# Patient Record
Sex: Female | Born: 1965
Health system: Southern US, Community
[De-identification: ages and names within clinical notes are randomized; demographics above are authoritative.]

## PROBLEM LIST (undated history)

## (undated) DIAGNOSIS — E038 Other specified hypothyroidism: Secondary | ICD-10-CM

## (undated) DIAGNOSIS — K219 Gastro-esophageal reflux disease without esophagitis: Secondary | ICD-10-CM

## (undated) DIAGNOSIS — Z87442 Personal history of urinary calculi: Secondary | ICD-10-CM

## (undated) DIAGNOSIS — N201 Calculus of ureter: Secondary | ICD-10-CM

## (undated) DIAGNOSIS — R112 Nausea with vomiting, unspecified: Secondary | ICD-10-CM

## (undated) DIAGNOSIS — Z9889 Other specified postprocedural states: Secondary | ICD-10-CM

## (undated) DIAGNOSIS — E039 Hypothyroidism, unspecified: Secondary | ICD-10-CM

## (undated) DIAGNOSIS — Z8679 Personal history of other diseases of the circulatory system: Secondary | ICD-10-CM

## (undated) DIAGNOSIS — R350 Frequency of micturition: Secondary | ICD-10-CM

## (undated) DIAGNOSIS — E063 Autoimmune thyroiditis: Secondary | ICD-10-CM

## (undated) DIAGNOSIS — R3915 Urgency of urination: Secondary | ICD-10-CM

## (undated) DIAGNOSIS — R7303 Prediabetes: Secondary | ICD-10-CM

## (undated) DIAGNOSIS — Z8489 Family history of other specified conditions: Secondary | ICD-10-CM

---

## 1998-01-03 ENCOUNTER — Other Ambulatory Visit: Admission: RE | Admit: 1998-01-03 | Discharge: 1998-01-03 | Payer: Self-pay | Admitting: Obstetrics and Gynecology

## 1998-10-29 ENCOUNTER — Encounter: Admission: RE | Admit: 1998-10-29 | Discharge: 1998-12-24 | Payer: Self-pay | Admitting: Family Medicine

## 1999-01-29 ENCOUNTER — Encounter: Payer: Self-pay | Admitting: Orthopedic Surgery

## 1999-01-29 ENCOUNTER — Ambulatory Visit (HOSPITAL_COMMUNITY): Admission: RE | Admit: 1999-01-29 | Discharge: 1999-01-29 | Payer: Self-pay | Admitting: Orthopedic Surgery

## 1999-02-11 ENCOUNTER — Other Ambulatory Visit: Admission: RE | Admit: 1999-02-11 | Discharge: 1999-02-11 | Payer: Self-pay | Admitting: Obstetrics and Gynecology

## 1999-07-23 ENCOUNTER — Other Ambulatory Visit: Admission: RE | Admit: 1999-07-23 | Discharge: 1999-07-23 | Payer: Self-pay | Admitting: Obstetrics & Gynecology

## 1999-11-29 ENCOUNTER — Inpatient Hospital Stay (HOSPITAL_COMMUNITY): Admission: AD | Admit: 1999-11-29 | Discharge: 1999-11-29 | Payer: Self-pay | Admitting: Obstetrics & Gynecology

## 2000-01-22 ENCOUNTER — Encounter (INDEPENDENT_AMBULATORY_CARE_PROVIDER_SITE_OTHER): Payer: Self-pay

## 2000-01-22 ENCOUNTER — Inpatient Hospital Stay (HOSPITAL_COMMUNITY): Admission: AD | Admit: 2000-01-22 | Discharge: 2000-01-25 | Payer: Self-pay | Admitting: Obstetrics and Gynecology

## 2000-01-27 ENCOUNTER — Encounter: Admission: RE | Admit: 2000-01-27 | Discharge: 2000-04-26 | Payer: Self-pay | Admitting: Obstetrics and Gynecology

## 2000-04-14 ENCOUNTER — Emergency Department (HOSPITAL_COMMUNITY): Admission: EM | Admit: 2000-04-14 | Discharge: 2000-04-15 | Payer: Self-pay | Admitting: Emergency Medicine

## 2000-04-15 ENCOUNTER — Encounter: Payer: Self-pay | Admitting: Emergency Medicine

## 2000-05-05 ENCOUNTER — Encounter (INDEPENDENT_AMBULATORY_CARE_PROVIDER_SITE_OTHER): Payer: Self-pay | Admitting: Specialist

## 2000-05-06 ENCOUNTER — Observation Stay (HOSPITAL_COMMUNITY): Admission: EM | Admit: 2000-05-06 | Discharge: 2000-05-07 | Payer: Self-pay | Admitting: Emergency Medicine

## 2000-05-06 ENCOUNTER — Encounter: Payer: Self-pay | Admitting: Emergency Medicine

## 2000-05-06 ENCOUNTER — Encounter: Payer: Self-pay | Admitting: General Surgery

## 2000-05-06 HISTORY — PX: LAPAROSCOPIC CHOLECYSTECTOMY: SUR755

## 2000-06-18 DIAGNOSIS — Z9889 Other specified postprocedural states: Secondary | ICD-10-CM

## 2000-06-18 DIAGNOSIS — Z85820 Personal history of malignant melanoma of skin: Secondary | ICD-10-CM

## 2000-06-18 HISTORY — DX: Other specified postprocedural states: Z85.820

## 2000-06-18 HISTORY — DX: Other specified postprocedural states: Z98.890

## 2001-06-29 ENCOUNTER — Ambulatory Visit (HOSPITAL_BASED_OUTPATIENT_CLINIC_OR_DEPARTMENT_OTHER): Admission: RE | Admit: 2001-06-29 | Discharge: 2001-06-29 | Payer: Self-pay | Admitting: General Surgery

## 2001-06-29 ENCOUNTER — Encounter (INDEPENDENT_AMBULATORY_CARE_PROVIDER_SITE_OTHER): Payer: Self-pay | Admitting: Specialist

## 2001-11-02 ENCOUNTER — Other Ambulatory Visit: Admission: RE | Admit: 2001-11-02 | Discharge: 2001-11-02 | Payer: Self-pay | Admitting: Obstetrics and Gynecology

## 2002-10-10 ENCOUNTER — Ambulatory Visit (HOSPITAL_COMMUNITY): Admission: RE | Admit: 2002-10-10 | Discharge: 2002-10-10 | Payer: Self-pay | Admitting: Obstetrics and Gynecology

## 2002-10-10 ENCOUNTER — Encounter: Payer: Self-pay | Admitting: Obstetrics and Gynecology

## 2002-11-05 ENCOUNTER — Inpatient Hospital Stay (HOSPITAL_COMMUNITY): Admission: AD | Admit: 2002-11-05 | Discharge: 2002-11-05 | Payer: Self-pay | Admitting: Obstetrics and Gynecology

## 2002-11-27 ENCOUNTER — Ambulatory Visit (HOSPITAL_COMMUNITY): Admission: RE | Admit: 2002-11-27 | Discharge: 2002-11-27 | Payer: Self-pay | Admitting: Obstetrics and Gynecology

## 2003-01-10 ENCOUNTER — Inpatient Hospital Stay (HOSPITAL_COMMUNITY): Admission: AD | Admit: 2003-01-10 | Discharge: 2003-01-10 | Payer: Self-pay | Admitting: Obstetrics and Gynecology

## 2003-01-11 ENCOUNTER — Inpatient Hospital Stay (HOSPITAL_COMMUNITY): Admission: AD | Admit: 2003-01-11 | Discharge: 2003-01-11 | Payer: Self-pay | Admitting: Obstetrics and Gynecology

## 2003-02-25 ENCOUNTER — Inpatient Hospital Stay (HOSPITAL_COMMUNITY): Admission: AD | Admit: 2003-02-25 | Discharge: 2003-02-27 | Payer: Self-pay | Admitting: Obstetrics and Gynecology

## 2003-04-01 ENCOUNTER — Other Ambulatory Visit: Admission: RE | Admit: 2003-04-01 | Discharge: 2003-04-01 | Payer: Self-pay | Admitting: Obstetrics and Gynecology

## 2003-07-09 ENCOUNTER — Ambulatory Visit (HOSPITAL_COMMUNITY): Admission: RE | Admit: 2003-07-09 | Discharge: 2003-07-09 | Payer: Self-pay | Admitting: Gastroenterology

## 2003-07-12 ENCOUNTER — Encounter: Admission: RE | Admit: 2003-07-12 | Discharge: 2003-07-12 | Payer: Self-pay | Admitting: Gastroenterology

## 2003-09-17 ENCOUNTER — Ambulatory Visit (HOSPITAL_COMMUNITY): Admission: RE | Admit: 2003-09-17 | Discharge: 2003-09-17 | Payer: Self-pay | Admitting: Gastroenterology

## 2003-12-05 ENCOUNTER — Ambulatory Visit: Payer: Self-pay | Admitting: Family Medicine

## 2004-05-05 ENCOUNTER — Other Ambulatory Visit: Admission: RE | Admit: 2004-05-05 | Discharge: 2004-05-05 | Payer: Self-pay | Admitting: Obstetrics and Gynecology

## 2004-05-06 ENCOUNTER — Ambulatory Visit (HOSPITAL_COMMUNITY): Admission: RE | Admit: 2004-05-06 | Discharge: 2004-05-06 | Payer: Self-pay | Admitting: Obstetrics and Gynecology

## 2004-05-08 ENCOUNTER — Ambulatory Visit: Payer: Self-pay | Admitting: Family Medicine

## 2005-01-07 ENCOUNTER — Ambulatory Visit: Payer: Self-pay | Admitting: Family Medicine

## 2005-05-11 ENCOUNTER — Other Ambulatory Visit: Admission: RE | Admit: 2005-05-11 | Discharge: 2005-05-11 | Payer: Self-pay | Admitting: Obstetrics and Gynecology

## 2005-05-18 ENCOUNTER — Ambulatory Visit (HOSPITAL_COMMUNITY): Admission: RE | Admit: 2005-05-18 | Discharge: 2005-05-18 | Payer: Self-pay | Admitting: Obstetrics and Gynecology

## 2005-05-27 ENCOUNTER — Ambulatory Visit: Payer: Self-pay | Admitting: Family Medicine

## 2005-06-24 ENCOUNTER — Ambulatory Visit: Payer: Self-pay | Admitting: Family Medicine

## 2005-12-02 ENCOUNTER — Ambulatory Visit: Payer: Self-pay | Admitting: Family Medicine

## 2006-05-10 DIAGNOSIS — K589 Irritable bowel syndrome without diarrhea: Secondary | ICD-10-CM | POA: Insufficient documentation

## 2006-05-10 DIAGNOSIS — C439 Malignant melanoma of skin, unspecified: Secondary | ICD-10-CM | POA: Insufficient documentation

## 2006-06-03 ENCOUNTER — Ambulatory Visit (HOSPITAL_COMMUNITY): Admission: RE | Admit: 2006-06-03 | Discharge: 2006-06-03 | Payer: Self-pay | Admitting: Obstetrics and Gynecology

## 2007-02-10 ENCOUNTER — Inpatient Hospital Stay (HOSPITAL_COMMUNITY): Admission: AD | Admit: 2007-02-10 | Discharge: 2007-02-11 | Payer: Self-pay | Admitting: Obstetrics and Gynecology

## 2007-03-14 ENCOUNTER — Inpatient Hospital Stay (HOSPITAL_COMMUNITY): Admission: AD | Admit: 2007-03-14 | Discharge: 2007-03-14 | Payer: Self-pay | Admitting: Obstetrics and Gynecology

## 2007-03-20 ENCOUNTER — Inpatient Hospital Stay (HOSPITAL_COMMUNITY): Admission: AD | Admit: 2007-03-20 | Discharge: 2007-03-20 | Payer: Self-pay | Admitting: Obstetrics and Gynecology

## 2007-04-19 ENCOUNTER — Inpatient Hospital Stay (HOSPITAL_COMMUNITY): Admission: AD | Admit: 2007-04-19 | Discharge: 2007-04-22 | Payer: Self-pay | Admitting: Obstetrics & Gynecology

## 2008-06-04 ENCOUNTER — Ambulatory Visit: Payer: Self-pay | Admitting: Genetic Counselor

## 2008-12-18 ENCOUNTER — Emergency Department (HOSPITAL_COMMUNITY): Admission: EM | Admit: 2008-12-18 | Discharge: 2008-12-19 | Payer: Self-pay | Admitting: Emergency Medicine

## 2010-02-08 ENCOUNTER — Encounter: Payer: Self-pay | Admitting: Obstetrics and Gynecology

## 2010-06-02 NOTE — H&P (Signed)
NAME:  Angie Manning, Angie Manning          ACCOUNT NO.:  [id number]   MEDICAL RECORD NO.:  [id number]          PATIENT TYPE:  INP   LOCATION:  9134                          FACILITY:  [facility]   PHYSICIAN:  Juluis Mire, M.D.   DATE OF BIRTH:  Oct 11, 1965   DATE OF ADMISSION:  04/19/2007  DATE OF DISCHARGE:                              HISTORY & PHYSICAL   The patient is a 45 year old gravida 4, para 1-1-0-2, female who is  admitted for induction of labor.   Her estimated gestational age at the present time is 37-6/7.  This is by  dates and early ultrasound.  She has been followed with slowly  decreasing amniotic fluid volumes.  Her amniotic fluid has gone from  10.7 to 9.4 to 8.5 today, 8.5 is the 14th percentile.  Her blood  pressure is 130/90.  She complains of headache.  Her cervix was  extremely favorable, being 4-5 and 50% effaced.  Due to the decreasing  amniotic fluid volumes, we decided to bring her in for artificial  rupture of membranes.  She is group B strep positive.  Antibiotics will  be required.   Other issues with the pregnancy:  She was at risk for advanced maternal  age.  She underwent first trimester screening that was normal and  declined amniocentesis.  She understands the limitations of first  trimester screening.   ALLERGIES:  She is allergic to [facility].   MEDICATIONS:  Prenatal vitamins.   For past medical history, family history and social history, please see  prenatal records.   REVIEW OF SYSTEMS:  Noncontributory.   PHYSICAL EXAM:  The patient's blood pressure 130/90.  Other vital signs  are stable.  LUNGS:  Clear.  CARDIOVASCULAR:  Regular rhythm and rate, a grade 2/6 systolic ejection  murmur.  ABDOMEN:  Gravid uterus consistent with dates.  PELVIC:  Cervix is 4-5, 50% effaced, vertex at a -1, membranes intact.  EXTREMITIES:  1+ edema.  Deep tendon reflex 2+ with no clonus.   IMPRESSION:  1. Intrauterine pregnancy at 37-6/7 weeks with elevated blood  pressure      and decreased amniotic fluid.  2. Positive group B strep.  3. Hypothyroidism.   PLAN:  The patient will be brought in for induction of labor.  We have  explained the issues with decreasing fluid and the need for delivery.  She feels comfortable with this and will be a direct admit.      Juluis Mire, M.D.  Electronically Signed     JSM/MEDQ  D:  04/19/2007  T:  04/20/2007  Job:  [id number]

## 2010-06-05 NOTE — Op Note (Signed)
NAME:  Angie Manning, Angie Manning                    ACCOUNT NO.:  [id number]   MEDICAL RECORD NO.:  [id number]                   PATIENT TYPE:  AMB   LOCATION:  ENDO                                 FACILITY:  [facility]   PHYSICIAN:  Graylin Shiver, M.D.                DATE OF BIRTH:  1965-04-18   DATE OF PROCEDURE:  09/17/2003  DATE OF DISCHARGE:                                 OPERATIVE REPORT   PROCEDURE:  Colonoscopy.   SURGEON:   INDICATIONS FOR PROCEDURE:  Rectal bleeding.   Informed consent was obtained after explanation of the risks of bleeding,  infection and perforation.   MEDICATIONS:  Fentanyl 100 mcg IV and Versed 8 mg IV.   DESCRIPTION OF PROCEDURE:  With the patient in the left lateral decubitus  position, a rectal examination was performed.  No masses were felt.  The  Olympus colonoscope was inserted into the rectum and advanced around the  colon to the cecum. Cecal landmarks were identified.  The cecum and  ascending colon were normal. The transverse colon normal.  The descending  colon, sigmoid and rectum were normal.  She tolerated the procedure well  without complications.   IMPRESSION:  Normal colonoscopy to the cecum.                                               Graylin Shiver, M.D.    Germain Osgood  D:  09/17/2003  T:  09/17/2003  Job:  [id number]   cc:   Hal Morales, M.D.  Fax: [phone]

## 2010-06-05 NOTE — H&P (Signed)
[facility]  Patient:    Angie Manning, Angie Manning                         MRN: [id number] Adm. Date:  [id number] Attending:  Leonard Schwartz Dictator:   Nigel Bridgeman, C.N.M.                         History and Physical  HISTORY OF PRESENT ILLNESS:   Angie Manning is a 45 year old gravida 1, para 0 at 37-1/2 weeks who presents today for induction secondary to preeclampsia.  Patient was seen in the office today with elevation of her blood pressure and 1+ protein on a catheterization specimen.  Pregnancy has been remarkable for:  #1 - History of pyelonephritis, #2 - history of ruptured disk in her back, #3 - history of gastroesophageal reflux disease and ulcers, #4 - strong family history of breast cancer, #5 - family history of mental retardation, #6 - rubella nonimmune.  PRENATAL LABORATORY DATA:     Blood type is A-positive.  Rh-antibody negative. VDRL nonreactive.  Rubella titer negative.  Hepatitis B surface antigen negative.  GC and Chlamydia cultures were negative.  Pap was normal.  Glucose challenge was normal.  AFP was normal.  Hemoglobin upon entry into practice was 12; it was 10.3 at 28 weeks.  Group B strep culture was negative at 36 weeks.  EDC of February 10, 2000 was established by last menstrual period and was in agreement with ultrasound at approximately 11 weeks.  HISTORY OF PRESENT PREGNANCY:  Patient entered care at approximately 11 weeks. She did have gastroesophageal reflux disease throughout her pregnancy.  She was started on Zantac at approximately 15 weeks.  She did have some cramping at approximately 29 weeks, was seen in Maternity Admissions and was monitored; she did have also a small amount of spotting at that time.  Fetal fibronectin was done at 30 weeks; that was positive.  She was maintained on increased rest and plan made for evaluation should she begin to have contractions.  She did well the rest of the pregnancy until she  presented to the office today with proteinuria and elevated blood pressure.  OBSTETRICAL HISTORY:          Patient is a primigravida.  MEDICAL HISTORY:              She was on oral contraceptives for 10 years and had stopped in January of 2001; she then used condoms.  She has occasional yeast infections.  She does have a ruptured disk in her back for which she has been followed by Dr. Harvie Junior.  She has a history of stomach ulcer since age 80.  She was on Zantac prior to pregnancy.  She has bladder infections at least once a year.  She has had pyelonephritis several times, but the last one was several years prior to pregnancy.  She had eye surgery in 1999.  At age 74, she had bladder surgery due to continuous bladder infections.  She had her wisdom teeth removed.  She has had constipation in the past.  ALLERGIES:                    She is sensitive to [facility], which causes hives, but she is not allergic to iodine.  FAMILY HISTORY:               Her mother is on Synthroid  secondary to part of her thyroid being removed due to cancer.  Her mother had breast cancer at age 100.  She has two paternal great-aunts deceased from breast cancer as older women.  She has maternal great-aunts deceased from breast cancer who were in their 6s.  Two maternal great-aunts had breast cancer postmenopausally.  GENETIC HISTORY:              Genetic history is remarkable for the father of the babys maternal aunt having mental retardation.  SOCIAL HISTORY:               Patient is married to the father of the baby. He is involved and supportive.  His name is [name].  Patient is Caucasian and of the [address] faith.  She has been followed by the physician service at [facility].  She denies any alcohol, drug or tobacco use during this pregnancy.  She is high-school-educated and is employed as an Research scientist (medical).  Her husband has some college and is employed as a  Human resources officer.  PHYSICAL EXAMINATION  VITAL SIGNS:                  Blood pressure at the office was 150/90.  Other vital signs are stable.  HEENT:                        Within normal limits.  LUNGS:                        Bilateral breath sounds are clear.  HEART:                        Regular rate and rhythm without murmur.  BREASTS:                      Soft and nontender.  ABDOMEN:                      Fundal height is approximately 37 cm.  Estimated fetal weight is 6 to 6-1/2 pounds.  Uterine contractions are very occasional and mild.  Fetal heart rate is in the 140s by Doppler at the office.  EXTREMITIES:                  Deep tendon reflexes are 2+ without clonus. There is a trace edema noted.  PELVIC:                       Cervical exam was deferred in the office. Vertex to [place].  GU:                           Catheterized UA shows positive urine protein.  IMPRESSION:                   1. Intrauterine pregnancy at 37-1/2 weeks.                               2. Preeclampsia.                               3. Unfavorable cervix.  4. Rubella nonimmune.  PLAN:                         1. Admit to birthing suite per consult with                                  Dr. Janine Limbo as attending                                  physician.                               2. Routine physician orders.                               3. Plan Cytotec for cervical ripening per                                  Dr. Percell Boston plan.                               4. Plan magnesium sulfate therapy for                                  preeclampsia treatment. DD:  01/23/00 TD:  01/23/00 Job: [id number]

## 2010-06-05 NOTE — H&P (Signed)
NAME:  AKEIRA, LAHM                    ACCOUNT NO.:  [id number]   MEDICAL RECORD NO.:  [id number]                   PATIENT TYPE:  INP   LOCATION:  9121                                 FACILITY:  [facility]   PHYSICIAN:  Crist Fat. Rivard, M.D.              DATE OF BIRTH:  [birth year]   DATE OF ADMISSION:  02/25/2003  DATE OF DISCHARGE:                                HISTORY & PHYSICAL   HISTORY OF PRESENT ILLNESS:  Ms. Polson is a 45 year old gravida 2,  para 1-0-0-1, at 37-6/7 weeks who presents with uterine contractions every 1-  2 minutes beginning approximately 30 minutes ago.  She had been seen in the  office earlier today for an ultrasound secondary to size less than dates.  Growth was at the approximately 17th percentile.  Her cervix was 3 cm.  She  was anticipating coming in in the morning for induction of labor.  She  questions whether she had some leaking of fluid, but does not feel like her  bag of waters is broken.  Uterine contractions are very strong and she does  report positive fetal movement.  Pregnancy has been remarkable for:  1. Small for gestational age versus IUGR.  2. History  of preeclampsia.  3. History of preterm labor with her previous pregnancy.  4. Advanced maternal age with amnio declined.  5. History of pyelonephritis.  6. History of severe nausea and vomiting.  7. History of melanoma.  8. History of gastroesophageal reflux disease.  9. History of tachycardia with negative cardiology evaluation.  10.      Preterm labor.  This pregnancy treated with Procardia.  11.      Positive beta Strep.  12.      Equivocal rubella.   LABORATORY DATA:  Prenatal labs:  Blood type is A positive, Rh antibody  negative.  VDRL nonreactive.  Rubella titer positive.  Hepatitis B surface  antigen negative.  Rubella titer is equivocal.  Pap was normal.  Glucose  challenge was normal.  GC and Chlamydia cultures were negative.  Hemoglobin  upon entry into practice was  11.6.  It was 9.9 at 27 weeks.  Group B Strep  culture was positive at 36 weeks.  Other cultures were negative.  EDC of  March 12, 2003 was established by last menstrual period and was in  agreement with ultrasound at approximately eight and 19 weeks.   HISTORY OF PRESENT PREGNANCY:  The patient entered care at approximately 8-  1/2 weeks.  She declined amnio.  Quad screen was normal.  She had some  episodes of tachycardia at 21 weeks.  She was evaluated by Dr. Fraser Din with  negative findings.  These did begin to resolve somewhat as the pregnancy  progressed.  A fetal fibronectin was done at 24 weeks that was negative.  She also had an ultrasound at approximately 26-28 weeks secondary to size  less than dates.  Preliminary ultrasound at 25  weeks showed no abruption.  There was a positive __________lobe noted.  Normal fluid.  Estimated fetal  weight was in the 75th-90th percentile.  There was a borderline right renal  pyelectasis noted.  Cervix was normal.  She had a slightly elevated one hour  Glucola, then had a normal three hour GTT.  She was 1 cm, 50% at 31 weeks.  Ultrasound showed growth at the 75th percentile.  She had a negative fetal  fibronectin at that time.  She had some irregular contractions on  terbutaline and was placed on Procardia at approximately 32 weeks.  Positive  beta Strep was noted in urine at 33 weeks.  She had another ultrasound at 34  weeks that showed growth at the  25th-50th percentile.  Amniotic fluid  volume was normal.  Cervix was normal.  Fetal fibronectin was done at that  time again and was negative.  She had normal NFTs when done.  At 36 weeks.  Her estimated fetal weight was in the 10th-25th percentile.  The slope of  growth had begun to fall off somewhat.  Cervix at that time was 2 cm.  She  had another ultrasound today for growth and fluid which showed normal fluid  but growth again slightly decreased at the 17th percentile.  She was then   indicated to come to maternity admissions unit on the morning of February 8  for induction.   OBSTETRICAL HISTORY:  In January of 2002 she had a vaginal birth of a female  infant, weight 5 pounds 11 ounces, at 37=2/7 weeks.  She was in labor eight  hours.  She had epidural anesthesia.  She was induced secondary to  preeclampsia and was on magnesium sulfate.  During that pregnancy she had  nausea and vomiting the entire pregnancy.  She also had a positive fetal  fibronectin at 30 weeks without cervical change.   PAST MEDICAL HISTORY:  1. She was on oral contraceptives in the past and did also use condoms in     the past.  2. She has a history of gastroesophageal reflux disease and ulcers and is on     Zantac.  3. She also has a history of constipation.  4. The patient has also had a history of pyelonephritis several times.   PAST SURGICAL HISTORY:  1. Eye surgery in October of 1999.  2. At age 56 she had bladder surgery.  3. She had a cholecystectomy in April of 2002.  4. She also had wisdom teeth removed.   Her only other hospitalization other than her surgeries was for childbirth.   ALLERGIES:  She is allergic to [facility].   FAMILY HISTORY:  Her mother had thyroid cancer.  Mother had breast cancer at  age 42.  Two paternal great aunts and two maternal great aunts had breast  cancer postmenopausally.   GENETIC HISTORY:  Remarkable for the patient being age 31 at the time of  delivery.  Father of the baby had a maternal aunt who is mentally retarded  from a possible birth injury.   SOCIAL HISTORY:  The patient is married to the father of the baby.  He is  involved and supported.  His name is [name].  The patient is Caucasian  and of the [facility] faith.  She has been followed by the physician's service  of  [facility].  She denies any alcohol, drug, or tobacco use during this pregnancy.  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  Her husband  has some college and is employed in Airline pilot.   PHYSICAL EXAMINATION:  VITAL SIGNS:  Stable.  The patient is afebrile.  GENERAL:  She appears to be in moderate distress from contractions.  HEENT:  Within normal limits.  LUNGS:  Bilateral breath sounds are clear.  HEART:  Regular rate and rhythm without murmur.  BREASTS:  Soft and nontender.  ABDOMEN:  Fundal height is approximately 31-32 cm.  Uterine contractions are  every 1-2 minutes, strong quality.  CERVIX:  Exam 5-6 cm, 90% vertex, with a -1 to a 0 station.  There is a  small amount of membranes noted over the baby's head.  No obvious leaking is  noted.  Fetal heart rate is reassuring, on auscultation between  contractions, and no audible decelerations are noted with contractions.  EXTREMITIES:  Deep tendon reflexes are 2+ without clonus.  There is trace  edema noted.   IMPRESSION:  1. Intrauterine pregnancy at 37-6/7 weeks.  2. Intrauterine growth retardation.  3. Positive group B Strep.  4. __________Level of placenta noted on ultrasound.   PLAN:  1. Admit to birthing suite for consult with Dr. Silverio Lay as attending     physician.  2. Routine physician orders.  3. Plain group B Strep prophylaxis with ampicillin 2 g IV q.6 secondary to     probable rapid labor.  4. The patient desires epidural, but is counseled that labor process may     progress too quickly for that to occur.     Renaldo Reel Emilee Hero, C.N.M.                   Crist Fat Rivard, M.D.    Leeanne Mannan  D:  02/25/2003  T:  02/25/2003  Job:  [id number]

## 2010-06-05 NOTE — Op Note (Signed)
[facility]  Patient:    Angie Manning, Angie Manning                 MRN: [id number] Proc. Date: 05/06/00 Adm. Date:  [id number] Disc. Date: [id number] Attending:  Henrene Dodge CC:         Angie Manning, M.D. [facility]   Operative Report  PREOPERATIVE DIAGNOSIS:   Chronic cholecystitis, possible common bile duct stone.  POSTOPERATIVE DIAGNOSIS:  Chronic cholecystitis, possible common bile duct stone.  OPERATION:  Laparoscopic cholecystectomy with cholangiogram.  SURGEON:  Anselm Pancoast. Zachery Dakins, M.D.  ASSISTANT:  Donnie Coffin. Samuella Cota, M.D.  INDICATIONS:  Ms. Angie Manning is a 45 year old Caucasian female four months postpartum who presented to the emergency room last evening with severe epigastric pain.  The patient was seen by the emergency room physicians, laboratory studies performed, and these showed a mildly elevated SGOT, SGPT, bilirubin was normal, and then an ultrasound of the gallbladder was obtained. This was completed in the early a.m., and I was called about 5 a.m. after the ultrasound was completed showing sludge and stones within her gallbladder.  It was suggested that I see her in the emergency room, and when I did an hour later she was still tender in the right upper quadrant and on discussing with this patient I think in this setting with the abnormal liver tests, the stones and tenderness in the right upper quadrant it would be best to proceed on with an urgent cholecystectomy.  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  She is nursing, and I think that hopefully she will be in the hospital one evening.  The patient was given 3 g of Unasyn.  It was around 9:30 when they finally got around to that and fortunately there were OR time available around 11:30.  DESCRIPTION OF PROCEDURE:  The patient was taken to the operative suite. Induction of general anesthesia, endotracheal tube and oral tube into the stomach.  The  abdomen which was fairly thin was prepped with Hibiclens as she is "allergic to iodine" and then draped in a sterile manner.  A small area below the umbilicus was incised with sharp dissection.  The fascia was identified.  This was picked up between two Kocher clamps.  A small opening was made.  The underlying peritoneum was thick enough that it was necessary to pick it up with two hemostats and a small opening made.  Traction sutures were placed and then the Hasson cannula.  The gallbladder was distended but not acutely inflamed.  There was no evidence of any acute inflammation around the gallbladder and looking at the cecum area it is unremarkable.  The pain is definitely up in the gallbladder area.  The upper 10 mm trocar was placed and two lateral 5 mm were placed.  The fascia was anesthetized with Marcaine.  The gallbladder was retracted upward and outward.  She has definite adhesions around the more proximal portion.  I did see stone sort of in the neck of the gallbladder.  The adhesions were stripped down and then the cystic artery and cystic duct were identified.  I placed a clip flush with the gallbladder and cystic duct junction.  The little cystic artery was doubly clipped proximally, single distally and then I divided the artery and this exposed the cystic duct quite well.  We then made a small opening into the cystic duct.  The common bile duct back pressure was not real large, and the  catheter was introduced to the short cystic duct and then x-ray was obtained.  The dye went promptly through the distal common bile duct into the duodenum.  The common bile duct is a little bit dilated.  I really question whether she has had a passage a common bile duct stone last evening when she was having the acute pain.  The catheter was removed.  The cystic duct was triply clipped and divided.  Then the gallbladder was removed through the bed both with hook electrocautery switching to  spatula.  She had sort of an intrahepatic gallbladder and it was a little more difficult to dissect free with good hemostasis.  We then placed the gallbladder in an Endo-catch bag and brought it out through the umbilicus. She has small stones about the size of a B-B in the gallbladder and these would certainly go through the cystic duct that we cannulated.  The umbilicus was closed with a figure-of-eight suture of 0 Vicryl and then the fascia anesthetized.  The 5 mm trocars were then removed under direct vision.  The irrigant fluid was aspirated.  We then released the carbon dioxide and withdrew the upper 10 mm trocar.  The subcutaneous wounds were closed with 3-0 Vicryl, and Benzoin and Steri-Strips on the skin.  The patient tolerated the procedure nicely and was sent to the recovery room in stable postoperative condition. DD:  05/06/00 TD:  05/08/00 Job: [id number]

## 2010-06-05 NOTE — Discharge Summary (Signed)
[facility]  Patient:    Angie Manning, Angie Manning                 MRN: [id number] Adm. Date:  [id number] Disc. Date: [id number] Attending:  Leonard Schwartz Dictator:   Wynelle Bourgeois, CNM                           Discharge Summary  ADMISSION DIAGNOSES:          1. Intrauterine pregnancy at 37-1/7 weeks.                               2. Preeclampsia.                               3. Unfavorable cervix.  DISCHARGE DIAGNOSES:          1. Intrauterine pregnancy at 37-1/7 weeks.                               2. Preeclampsia.                               3. Unfavorable cervix.                               4. Delivered of a viable female infant named                                  [place], weight 5 pounds 11 ounces,                                  Apgars 7 and 9.                               5. Proteinuria, questionable pregnancy induced                                  hypertension.  PROCEDURES:                   1. IV fluids.                               2. Magnesium sulfate administration.                               3. Pitocin induction of labor.                               4. Epidural anesthesia.                               5. Spontaneous vaginal delivery by Dr. Pennie Rushing.  HOSPITAL COURSE:  Patient was admitted on January 22, 2000 for induction of labor secondary to questionable preeclampsia. She had 1+ proteinuria on catheterized UA and slight increase in her blood pressures, 150/90s. She had no other symptoms of PIH at that time. On admission, her vital signs were stable except for her blood pressure 150/90. Fetal heart tones were 140s. She had some uterine contractions with DTRs at 2+, no clonus, trace to 1+ edema. Her cervix was fingertip, 50%, and -2 to -3 and posterior, and she had a reactive nonstress test. Cytotec was placed and repeated x one four hours later. Following the second dose she was begun on high-dose  Pitocin for induction of labor. She progressed well. On January 23, 2000 at 10:45 her cervix was 1 cm, 70%-80% effaced, and -2 station with Pitocin at 24 milliunits per minute. She continued to be stable. She progressed slowly during the day on January 23, 2000 and developed some variable decelerations with the fetal heart rate and amnioinfusion was started to attempt to correct the variable decelerations. Pitocin was turned off as amnioinfusion was instituted. Fetal heart rate stabilized and Pitocin was restarted. She progressed to 10 cm with +1 station at 1930 hours on January 23, 2000 and began pushing. She had a spontaneous vaginal delivery at 2145 on January 23, 2000 of a viable female infant, Apgars 7 and 9, over intact perineum with bilateral labial lacerations which were not repaired.  On postpartum day #1 she was doing well. Hemoglobin was 9.4. She was stable. Abdomen was soft. Magnesium sulfate administration continued. Intake and output were equal. She continued the magnesium for 24 hours postpartum.  On January 25, 2000 she was doing well. She had stable vital signs and was afebrile. Physical exam was within normal limits with stable heart rate and clear lungs. Fundus was firm. Lochia was scant. Perineum was intact. Extremities within normal limits and she was deemed to have received the full benefit of her hospital stay. Following the discontinuance of her magnesium sulfate, she was discharged home by Dr. Stefano Gaul at 7 p.m. on January 25, 2000.  DISCHARGE LABORATORIES:       White blood cell count 13.9, hemoglobin 9.4, hematocrit 26.7, platelets 240. Magnesium level was 5.8 prior to discontinuing the magnesium. RPR nonreactive. Chemistries were within normal limits except for an alkaline phosphatase of 209.  DISCHARGE MEDICATIONS:        1. Micronor one p.o. q.d. for contraception.                               2. Motrin 600 mg p.o. q.6h. p.r.n. cramping.  DISCHARGE  INSTRUCTIONS:       Per [facility] handout.  DISCHARGE FOLLOWUP:           In six weeks at [facility]. DD:  01/25/00 TD:  01/25/00 Job: [id number]

## 2010-06-05 NOTE — Op Note (Signed)
NAME:  Angie Manning, Angie Manning                    ACCOUNT NO.:  [id number]   MEDICAL RECORD NO.:  [id number]                   PATIENT TYPE:  AMB   LOCATION:  ENDO                                 FACILITY:  [facility]   PHYSICIAN:  Graylin Shiver, M.D.                DATE OF BIRTH:  1965-02-13   DATE OF PROCEDURE:  07/09/2003  DATE OF DISCHARGE:                                 OPERATIVE REPORT   PROCEDURE PERFORMED:  Esophagogastroduodenoscopy with biopsy for CLO test.   ENDOSCOPIST:  Graylin Shiver, M.D.   INDICATIONS FOR PROCEDURE:  Epigastric abdominal pain.   Informed consent was obtained after the risks of bleeding, infection and  perforation.   PREMEDICATIONS:  Fentanyl 50 mcg IV, Versed 5 mg IV.   DESCRIPTION OF PROCEDURE:  With the patient in the left lateral decubitus  position the Olympus gastroscope was inserted into the oropharynx and passed  into the esophagus.  It was advanced down the esophagus and into the stomach  and into the duodenum.  The second portion and bulb of the duodenum looked  normal.  The stomach showed a normal-appearing antrum and body.  The fundus  and cardia seen on retroflexion was normal. Biopsy was obtained from the  distal stomach for CLO test.  The esophagus revealed the esophagogastric  junction to be at 37 cm.  It looked normal.  The esophageal mucosa looked  normal in its entirety.  The patient tolerated the procedure well without  complications.   IMPRESSION:  Normal esophagogastroduodenoscopy.   PLAN:  The patient is going to have an abdominal ultrasound and will follow  up in the office.                                               Graylin Shiver, M.D.    Germain Osgood  D:  07/09/2003  T:  07/10/2003  Job:  [id number]   cc:   Hal Morales, M.D.  Fax: [phone]

## 2010-10-09 LAB — URINE MICROSCOPIC-ADD ON

## 2010-10-09 LAB — URINALYSIS, ROUTINE W REFLEX MICROSCOPIC
Hgb urine dipstick: NEGATIVE
Nitrite: NEGATIVE
Protein, ur: 30 — AB
Specific Gravity, Urine: >1.03 — ABNORMAL HIGH
pH: 6

## 2010-10-09 LAB — CBC
MCHC: 34.4
RBC: 3.67 — ABNORMAL LOW
WBC: 8.7

## 2010-10-09 LAB — COMPREHENSIVE METABOLIC PANEL
Alkaline Phosphatase: 78
CO2: 25
Creatinine, Ser: 0.48
GFR calc non Af Amer: >60
Glucose, Bld: 98
Potassium: 2.3 — CL
Sodium: 138
Total Bilirubin: 0.4

## 2010-10-09 LAB — URIC ACID: Uric Acid, Serum: 4.1

## 2010-10-09 LAB — LACTATE DEHYDROGENASE: LDH: 146

## 2010-10-13 LAB — CBC
HCT: 29.1 — ABNORMAL LOW
HCT: 30.7 — ABNORMAL LOW
Hemoglobin: 9.8 — ABNORMAL LOW
MCHC: 34
MCHC: 34.2
MCV: 87.9
MCV: 88
MCV: 88.4
Platelets: 190
Platelets: 216
RBC: 3.48 — ABNORMAL LOW
RDW: 13.8
RDW: 13.9
RDW: 14.2
WBC: 7.6
WBC: 8.2

## 2010-10-13 LAB — COMPREHENSIVE METABOLIC PANEL
AST: 24
Alkaline Phosphatase: 109
BUN: 6
Calcium: 8.2 — ABNORMAL LOW
Creatinine, Ser: 0.55
Potassium: 2.5 — CL
Total Bilirubin: 0.5

## 2010-10-13 LAB — RPR: RPR Ser Ql: NONREACTIVE

## 2010-10-13 LAB — URIC ACID: Uric Acid, Serum: 4.6

## 2010-10-13 LAB — ELECTROLYTE PANEL
Potassium: 3 — ABNORMAL LOW
Sodium: 138

## 2013-03-14 ENCOUNTER — Encounter (HOSPITAL_BASED_OUTPATIENT_CLINIC_OR_DEPARTMENT_OTHER): Payer: Self-pay | Admitting: *Deleted

## 2013-03-14 ENCOUNTER — Other Ambulatory Visit: Payer: Self-pay | Admitting: Urology

## 2013-03-14 NOTE — Progress Notes (Signed)
NPO AFTER MN. ARRIVE AT 5945. (PT MAY MOVE TO 1015, SELITA AT OFFICE IS TO LET PT KNOW,  IF SO PT AWARE TO ARRIVE AT 8592).  NEEDS HG AND URINE PREG. PT WILL TAKE ACIPHEX HS BEFORE DOS. MAY TAKE HYDROCODONE IF NEEDED W/ SIPS OF WATER.

## 2013-03-15 ENCOUNTER — Encounter (HOSPITAL_BASED_OUTPATIENT_CLINIC_OR_DEPARTMENT_OTHER)
Admission: RE | Disposition: A | Discharge status: Home / Self Care | Payer: Self-pay | Source: Ambulatory Visit | Attending: Urology

## 2013-03-15 ENCOUNTER — Ambulatory Visit (HOSPITAL_BASED_OUTPATIENT_CLINIC_OR_DEPARTMENT_OTHER): Payer: BC Managed Care – PPO | Admitting: Anesthesiology

## 2013-03-15 ENCOUNTER — Encounter (HOSPITAL_BASED_OUTPATIENT_CLINIC_OR_DEPARTMENT_OTHER): Payer: BC Managed Care – PPO | Admitting: Anesthesiology

## 2013-03-15 ENCOUNTER — Encounter (HOSPITAL_BASED_OUTPATIENT_CLINIC_OR_DEPARTMENT_OTHER): Payer: Self-pay | Admitting: *Deleted

## 2013-03-15 ENCOUNTER — Ambulatory Visit (HOSPITAL_BASED_OUTPATIENT_CLINIC_OR_DEPARTMENT_OTHER)
Admission: RE | Admit: 2013-03-15 | Discharge: 2013-03-15 | Disposition: A | Discharge status: Home / Self Care | Payer: BC Managed Care – PPO | Source: Ambulatory Visit | Attending: Urology | Admitting: Urology

## 2013-03-15 DIAGNOSIS — E039 Hypothyroidism, unspecified: Secondary | ICD-10-CM | POA: Insufficient documentation

## 2013-03-15 DIAGNOSIS — Z9089 Acquired absence of other organs: Secondary | ICD-10-CM | POA: Insufficient documentation

## 2013-03-15 DIAGNOSIS — Z91013 Allergy to seafood: Secondary | ICD-10-CM | POA: Insufficient documentation

## 2013-03-15 DIAGNOSIS — R350 Frequency of micturition: Secondary | ICD-10-CM | POA: Insufficient documentation

## 2013-03-15 DIAGNOSIS — R3915 Urgency of urination: Secondary | ICD-10-CM | POA: Insufficient documentation

## 2013-03-15 DIAGNOSIS — N201 Calculus of ureter: Secondary | ICD-10-CM | POA: Insufficient documentation

## 2013-03-15 DIAGNOSIS — N200 Calculus of kidney: Secondary | ICD-10-CM | POA: Insufficient documentation

## 2013-03-15 DIAGNOSIS — Z79899 Other long term (current) drug therapy: Secondary | ICD-10-CM | POA: Insufficient documentation

## 2013-03-15 DIAGNOSIS — K219 Gastro-esophageal reflux disease without esophagitis: Secondary | ICD-10-CM | POA: Insufficient documentation

## 2013-03-15 HISTORY — DX: Urgency of urination: R39.15

## 2013-03-15 HISTORY — DX: Calculus of ureter: N20.1

## 2013-03-15 HISTORY — PX: URETEROSCOPY: SHX842

## 2013-03-15 HISTORY — DX: Frequency of micturition: R35.0

## 2013-03-15 HISTORY — DX: Hypothyroidism, unspecified: E03.9

## 2013-03-15 HISTORY — DX: Gastro-esophageal reflux disease without esophagitis: K21.9

## 2013-03-15 HISTORY — PX: HOLMIUM LASER APPLICATION: SHX5852

## 2013-03-15 LAB — POCT PREGNANCY, URINE: Preg Test, Ur: NEGATIVE

## 2013-03-15 LAB — POCT HEMOGLOBIN-HEMACUE: Hemoglobin: 12.6 g/dL (ref 12.0–15.0)

## 2013-03-15 SURGERY — URETEROSCOPY
Anesthesia: General | Site: Ureter | Laterality: Right

## 2013-03-15 MED ORDER — ONDANSETRON HCL 4 MG/2ML IJ SOLN
INTRAMUSCULAR | Status: DC | PRN
  Administered 2013-03-15: 4 mg via INTRAVENOUS

## 2013-03-15 MED ORDER — CEPHALEXIN 500 MG PO CAPS: 500.0000 mg | ORAL_CAPSULE | Freq: Four times a day (QID) | ORAL | Status: DC

## 2013-03-15 MED ORDER — METOCLOPRAMIDE HCL 5 MG/ML IJ SOLN
INTRAMUSCULAR | Status: DC | PRN
  Administered 2013-03-15: 10 mg via INTRAVENOUS

## 2013-03-15 MED ORDER — LACTATED RINGERS IV SOLN
INTRAVENOUS | Status: DC
  Administered 2013-03-15: 07:00:00 via INTRAVENOUS
  Filled 2013-03-15: qty 1000

## 2013-03-15 MED ORDER — FENTANYL CITRATE 0.05 MG/ML IJ SOLN
INTRAMUSCULAR | Status: DC | PRN
  Administered 2013-03-15 (×8): 25 ug via INTRAVENOUS

## 2013-03-15 MED ORDER — PROPOFOL 10 MG/ML IV BOLUS
INTRAVENOUS | Status: DC | PRN
  Administered 2013-03-15: 50 mg via INTRAVENOUS
  Administered 2013-03-15: 200 mg via INTRAVENOUS

## 2013-03-15 MED ORDER — LACTATED RINGERS IV SOLN
INTRAVENOUS | Status: DC
Start: 2013-03-15 — End: 2013-03-15
  Filled 2013-03-15: qty 1000

## 2013-03-15 MED ORDER — CEFAZOLIN SODIUM 1-5 GM-% IV SOLN
1.0000 g | INTRAVENOUS | Status: DC
  Filled 2013-03-15: qty 50

## 2013-03-15 MED ORDER — SODIUM CHLORIDE 0.9 % IR SOLN: Status: DC | PRN

## 2013-03-15 MED ORDER — KETOROLAC TROMETHAMINE 30 MG/ML IJ SOLN
INTRAMUSCULAR | Status: DC | PRN
  Administered 2013-03-15: 30 mg via INTRAVENOUS

## 2013-03-15 MED ORDER — CEFAZOLIN SODIUM-DEXTROSE 2-3 GM-% IV SOLR
2.0000 g | INTRAVENOUS | Status: DC
  Filled 2013-03-15: qty 50

## 2013-03-15 MED ORDER — MIDAZOLAM HCL 5 MG/5ML IJ SOLN
INTRAMUSCULAR | Status: DC | PRN
  Administered 2013-03-15 (×2): 1 mg via INTRAVENOUS

## 2013-03-15 MED ORDER — HYDROCODONE-ACETAMINOPHEN 5-325 MG PO TABS
1.0000 | ORAL_TABLET | Freq: Four times a day (QID) | ORAL | Status: DC | PRN
  Administered 2013-03-15: 1 via ORAL
  Filled 2013-03-15: qty 1

## 2013-03-15 MED ORDER — CEFAZOLIN SODIUM-DEXTROSE 2-3 GM-% IV SOLR
INTRAVENOUS | Status: DC | PRN
  Administered 2013-03-15: 2 g via INTRAVENOUS

## 2013-03-15 MED ORDER — SODIUM CHLORIDE 0.9 % IR SOLN
Status: DC | PRN
  Administered 2013-03-15: 2000 mL

## 2013-03-15 MED ORDER — FENTANYL CITRATE 0.05 MG/ML IJ SOLN
INTRAMUSCULAR | Status: AC
  Filled 2013-03-15: qty 4

## 2013-03-15 MED ORDER — BELLADONNA ALKALOIDS-OPIUM 16.2-60 MG RE SUPP
RECTAL | Status: AC
  Filled 2013-03-15: qty 1

## 2013-03-15 MED ORDER — GLYCOPYRROLATE 0.2 MG/ML IJ SOLN
INTRAMUSCULAR | Status: DC | PRN
  Administered 2013-03-15: 0.2 mg via INTRAVENOUS

## 2013-03-15 MED ORDER — FENTANYL CITRATE 0.05 MG/ML IJ SOLN
25.0000 ug | INTRAMUSCULAR | Status: DC | PRN
  Filled 2013-03-15: qty 1

## 2013-03-15 MED ORDER — DEXAMETHASONE SODIUM PHOSPHATE 4 MG/ML IJ SOLN
INTRAMUSCULAR | Status: DC | PRN
  Administered 2013-03-15: 4 mg via INTRAVENOUS

## 2013-03-15 MED ORDER — HYDROCODONE-ACETAMINOPHEN 5-325 MG PO TABS
ORAL_TABLET | ORAL | Status: AC
  Filled 2013-03-15: qty 1

## 2013-03-15 MED ORDER — MIDAZOLAM HCL 2 MG/2ML IJ SOLN
INTRAMUSCULAR | Status: AC
  Filled 2013-03-15: qty 2

## 2013-03-15 MED ORDER — LACTATED RINGERS IV SOLN
INTRAVENOUS | Status: DC | PRN
  Administered 2013-03-15 (×2): via INTRAVENOUS

## 2013-03-15 MED ORDER — BELLADONNA ALKALOIDS-OPIUM 16.2-60 MG RE SUPP
RECTAL | Status: DC | PRN
  Administered 2013-03-15: 1 via RECTAL

## 2013-03-15 MED ORDER — LIDOCAINE HCL (CARDIAC) 20 MG/ML IV SOLN
INTRAVENOUS | Status: DC | PRN
  Administered 2013-03-15: 40 mg via INTRAVENOUS

## 2013-03-15 SURGICAL SUPPLY — 36 items
ADAPTER CATH URET PLST 4-6FR (CATHETERS) IMPLANT
BAG DRAIN URO-CYSTO SKYTR STRL (DRAIN) ×2 IMPLANT
BASKET LASER NITINOL 1.9FR (BASKET) IMPLANT
BASKET STNLS GEMINI 4WIRE 3FR (BASKET) IMPLANT
BASKET ZERO TIP NITINOL 2.4FR (BASKET) ×2 IMPLANT
BRUSH URET BIOPSY 3F (UROLOGICAL SUPPLIES) IMPLANT
CANISTER SUCT LVC 12 LTR MEDI- (MISCELLANEOUS) ×2 IMPLANT
CATH INTERMIT  6FR 70CM (CATHETERS) ×2 IMPLANT
CATH URET 5FR 28IN CONE TIP (BALLOONS)
CATH URET 5FR 28IN OPEN ENDED (CATHETERS) IMPLANT
CATH URET 5FR 70CM CONE TIP (BALLOONS) IMPLANT
CLOTH BEACON ORANGE TIMEOUT ST (SAFETY) ×2 IMPLANT
DRAPE CAMERA CLOSED 9X96 (DRAPES) ×2 IMPLANT
ELECT REM PT RETURN 9FT ADLT (ELECTROSURGICAL)
ELECTRODE REM PT RTRN 9FT ADLT (ELECTROSURGICAL) IMPLANT
FIBER LASER FLEXIVA 200 (UROLOGICAL SUPPLIES) ×2 IMPLANT
FIBER LASER FLEXIVA 365 (UROLOGICAL SUPPLIES) ×2 IMPLANT
GLOVE BIO SURGEON STRL SZ8 (GLOVE) ×2 IMPLANT
GLOVE BIOGEL PI IND STRL 7.5 (GLOVE) ×1 IMPLANT
GLOVE BIOGEL PI INDICATOR 7.5 (GLOVE) ×1
GLOVE SURG SS PI 7.5 STRL IVOR (GLOVE) ×2 IMPLANT
GOWN PREVENTION PLUS LG XLONG (DISPOSABLE) ×2 IMPLANT
GOWN STRL REIN XL XLG (GOWN DISPOSABLE) ×2 IMPLANT
GOWN STRL REUS W/TWL XL LVL3 (GOWN DISPOSABLE) ×4 IMPLANT
GUIDEWIRE 0.038 PTFE COATED (WIRE) IMPLANT
GUIDEWIRE ANG ZIPWIRE 038X150 (WIRE) IMPLANT
GUIDEWIRE STR DUAL SENSOR (WIRE) ×2 IMPLANT
IV NS IRRIG 3000ML ARTHROMATIC (IV SOLUTION) ×4 IMPLANT
KIT BALLIN UROMAX 15FX10 (LABEL) IMPLANT
KIT BALLN UROMAX 15FX4 (MISCELLANEOUS) IMPLANT
KIT BALLN UROMAX 26 75X4 (MISCELLANEOUS)
PACK CYSTOSCOPY (CUSTOM PROCEDURE TRAY) ×2 IMPLANT
SET HIGH PRES BAL DIL (LABEL)
SHEATH ACCESS URETERAL 38CM (SHEATH) IMPLANT
SHEATH ACCESS URETERAL 54CM (SHEATH) IMPLANT
SHEATH URET ACCESS 12FR/35CM (UROLOGICAL SUPPLIES) ×2 IMPLANT

## 2013-03-15 NOTE — H&P (Signed)
H&P  Chief Complaint:Kidney stones  History of Present Illness: Angie Manning is a 48 y.o. year old female who presented to my odffice yesterday w/ 2 week h/o right flank pain, with eventual right lower corner pain, urgency and frequency. CT urogram revealed 2 separate 5-6 mm calculi in the right kidney-one at the UVJ, 1 approximately 4-5 cm proximal. Because of the length of time experiencing symptoms and the size of the stones, it was recommended that she undergo management. We talked about lithotripsy versus ureteroscopy with holmium laser. Because of the 2 stones, I recommend ureteroscopy risks and complications of the procedure have been discussed with the patient. She understands these and desires to proceed.  Past Medical History  Diagnosis Date  . Hypothyroidism     CURRENTLY NO MEDS  . GERD (gastroesophageal reflux disease)   . Right ureteral stone   . Frequency of urination   . Urgency of urination     Past Surgical History  Procedure Laterality Date  . Laparoscopic cholecystectomy  05-06-2000    Home Medications:  Medications Prior to Admission  Medication Sig Dispense Refill  . ciprofloxacin (CIPRO) 500 MG tablet Take 500 mg by mouth 2 (two) times daily.      Marland Kitchen HYDROcodone-acetaminophen (NORCO/VICODIN) 5-325 MG per tablet Take 1 tablet by mouth every 6 (six) hours as needed for moderate pain.      . RABEprazole (ACIPHEX) 20 MG tablet Take 20 mg by mouth as needed.         Allergies:  Allergies  Allergen Reactions  . Shrimp [Shellfish Allergy] Hives    History reviewed. No pertinent family history.  Social History:  reports that she has never smoked. She has never used smokeless tobacco. She reports that she does not drink alcohol or use illicit drugs.  ROS: A complete review of systems was performed.  All systems are negative except for pertinent findings as noted.  Physical Exam:  Vital signs in last 24 hours: Temp:  [98 F (36.7 C)] 98 F (36.7 C)  (02/26 0655) Pulse Rate:  [101] 101 (02/26 0655) Resp:  [16] 16 (02/26 0655) BP: (136)/(89) 136/89 mmHg (02/26 0655) SpO2:  [100 %] 100 % (02/26 0655) Weight:  [81.647 kg (180 lb)-83.462 kg (184 lb)] 83.462 kg (184 lb) (02/26 0655) General:  Alert and oriented, No acute distress HEENT: Normocephalic, atraumatic Neck: No JVD or lymphadenopathy Cardiovascular: Regular rate and rhythm Lungs: Clear bilaterally Abdomen: Soft, nontender, nondistended, no abdominal masses. Mild right CVAT Back: No CVA tenderness Extremities: No edema Neurologic: Grossly intact  Laboratory Data:  Results for orders placed during the hospital encounter of 03/15/13 (from the past 24 hour(s))  POCT PREGNANCY, URINE     Status: None   Collection Time    03/15/13  6:57 AM      Result Value Ref Range   Preg Test, Ur NEGATIVE  NEGATIVE  POCT HEMOGLOBIN-HEMACUE     Status: None   Collection Time    03/15/13  7:21 AM      Result Value Ref Range   Hemoglobin 12.6  12.0 - 15.0 g/dL   No results found for this or any previous visit (from the past 240 hour(s)). Creatinine: No results found for this basename: CREATININE,  in the last 168 hours  Radiologic Imaging: No results found.  Impression/Assessment:  2 right ureteral calculi   Plan:  Left ureteroscopy, left ureteral stone management w/ laser  Jorja Loa 03/15/2013, 8:25 AM  Lillette Boxer. Francella Barnett MD

## 2013-03-15 NOTE — Anesthesia Preprocedure Evaluation (Signed)
Anesthesia Evaluation  Patient identified by MRN, date of birth, ID band Patient awake    Reviewed: Allergy & Precautions, H&P , NPO status , Patient's Chart, lab work & pertinent test results  Airway Mallampati: II TM Distance: >3 FB Neck ROM: full    Dental no notable dental hx. (+) Teeth Intact, Dental Advisory Given   Pulmonary neg pulmonary ROS,  breath sounds clear to auscultation  Pulmonary exam normal       Cardiovascular Exercise Tolerance: Good negative cardio ROS  Rhythm:regular Rate:Normal     Neuro/Psych negative neurological ROS  negative psych ROS   GI/Hepatic negative GI ROS, Neg liver ROS, GERD-  Medicated and Controlled,  Endo/Other  negative endocrine ROSHypothyroidism   Renal/GU negative Renal ROS  negative genitourinary   Musculoskeletal   Abdominal   Peds  Hematology negative hematology ROS (+)   Anesthesia Other Findings   Reproductive/Obstetrics negative OB ROS                           Anesthesia Physical Anesthesia Plan  ASA: I  Anesthesia Plan: General   Post-op Pain Management:    Induction: Intravenous  Airway Management Planned: LMA  Additional Equipment:   Intra-op Plan:   Post-operative Plan:   Informed Consent: I have reviewed the patients History and Physical, chart, labs and discussed the procedure including the risks, benefits and alternatives for the proposed anesthesia with the patient or authorized representative who has indicated his/her understanding and acceptance.   Dental Advisory Given  Plan Discussed with: CRNA and Surgeon  Anesthesia Plan Comments:         Anesthesia Quick Evaluation

## 2013-03-15 NOTE — Op Note (Signed)
PATIENT:  Angie Manning  PRE-OPERATIVE DIAGNOSIS: Left ureteral calculi, each proximally 6 mm  POST-OPERATIVE DIAGNOSIS: Same  PROCEDURE: Cystoscopy, right ureteroscopy with holmium laser and extraction of ureteral calculi  SURGEON:  Lillette Boxer. Humaira Sculley, M.D.  ANESTHESIA:  General  EBL:  Minimal  DRAINS: None  LOCAL MEDICATIONS USED:  None  SPECIMEN:  Stone, the patient's husband  INDICATION: [name] is a 48 year old female with symptomatic right ureteral calculi. She presented yesterday with significant pain over the past 2 weeks, CT revealed 2 right ureteral calculi, each approximately 5-6 mm in size. Due to the size, location and length of the patient's symptoms, it was recommended that she have these treated. She presents at this time, following an in depth discussion of treatment options, for right ureteroscopy, holmium laser and extraction of ureteral calculi. She understands the procedure, risks and complications and desires to proceed.  Description of procedure: The patient was properly identified and marked (if applicable) in the holding area. They were then  taken to the operating room and placed on the table in a supine position. General anesthesia was then administered. Once fully anesthetized the patient was moved to the dorsolithotomy position and the genitalia and perineum were sterilely prepped and draped in standard fashion. An official timeout was then performed.  A 22 French panendoscope was advanced into the patient's bladder. Using the 12 lens, circumferential inspection of the bladder was performed. Ureteral orifices were normal in configuration and location, bladder was without lesions. A 0.038 inch sensor-tip guidewire was advanced under fluoroscopic guidance into the right renal pelvis. Following this, the distal ureter was dilated with a 12 French ureteral access sheath. The sheath was removed, and a rigid ureteroscope was advanced under direct  vision up to the first stone, approximately 4 cm up the ureter. This was felt to be too large to extract, and I used the 365  fiber the holmium laser, to apply laser energy to the stone and fragmented. Most of the stone fragments were sand-like fragments and rinsed distally and the bladder. I extracted 2-3 larger stones and passed them into the bladder. I then advanced the scope to the more proximal stone, which was felt small enough to be extracted with the Nitinol basket, intact. It was grasped and easily removed. The stone was saved for analysis and given to the patient's husband. I then advanced    the ureteroscope all the way to the renal pelvis, no further stones were seen. There was no significant ureteral trauma or spasm, and I did not feel like a stent needed to be left in. The scope was removed, following that the guidewire was removed, and the bladder drained. The patient tolerated the procedure well. She was awakened and taken to the PACU in stable condition.   We will have her followed up the next 3-4 weeks. I think it worthwhile to perform 24-hour urine for stone risk and make sure she has a serum calcium level drawn.

## 2013-03-15 NOTE — Transfer of Care (Signed)
Immediate Anesthesia Transfer of Care Note  Patient: Angie Manning  Procedure(s) Performed: Procedure(s) (LRB): URETEROSCOPY (Right) HOLMIUM LASER APPLICATION (Right)  Patient Location: PACU  Anesthesia Type: General  Level of Consciousness: awake, sedated, patient cooperative and responds to stimulation  Airway & Oxygen Therapy: Patient Spontanous Breathing and Patient connected to face mask oxygen  Post-op Assessment: Report given to PACU RN, Post -op Vital signs reviewed and stable and Patient moving all extremities  Post vital signs: Reviewed and stable  Complications: No apparent anesthesia complications

## 2013-03-15 NOTE — Discharge Instructions (Signed)
POSTOPERATIVE CARE AFTER URETEROSCOPY ° ° ° °Diet ° °Once you have adequately recovered from anesthesia, you may gradually advance your diet, as tolerated, to your regular diet. ° °Activities ° °You may gradually increase your activities to your normal unrestricted level the day following your procedure. ° °Medications ° °You should resume all preoperative medications. If you are on aspirin-like compounds, you should not resume these until the blood clears from your urine. If given an antibiotic by the surgeon, take these until they are completed. You may also be given, if you have a stent, medications to decrease the urinary frequency and urgency. ° °Pain ° °After ureteroscopy, there may be some pain on the side of the scope. Take your pain medicine for this. Usually, this pain resolves within a day or 2. ° °Fever ° °Please report any fever over 100° to the doctor. ° °Post Anesthesia Home Care Instructions ° °Activity: °Get plenty of rest for the remainder of the day. A responsible adult should stay with you for 24 hours following the procedure.  °For the next 24 hours, DO NOT: °-Drive a car °-Operate machinery °-Drink alcoholic beverages °-Take any medication unless instructed by your physician °-Make any legal decisions or sign important papers. ° °Meals: °Start with liquid foods such as gelatin or soup. Progress to regular foods as tolerated. Avoid greasy, spicy, heavy foods. If nausea and/or vomiting occur, drink only clear liquids until the nausea and/or vomiting subsides. Call your physician if vomiting continues. ° °Special Instructions/Symptoms: °Your throat may feel dry or sore from the anesthesia or the breathing tube placed in your throat during surgery. If this causes discomfort, gargle with warm salt water. The discomfort should disappear within 24 hours. ° °

## 2013-03-15 NOTE — Anesthesia Postprocedure Evaluation (Signed)
  Anesthesia Post-op Note  Patient: Angie Manning  Procedure(s) Performed: Procedure(s) (LRB): URETEROSCOPY (Right) HOLMIUM LASER APPLICATION (Right)  Patient Location: PACU  Anesthesia Type: General  Level of Consciousness: awake and alert   Airway and Oxygen Therapy: Patient Spontanous Breathing  Post-op Pain: mild  Post-op Assessment: Post-op Vital signs reviewed, Patient's Cardiovascular Status Stable, Respiratory Function Stable, Patent Airway and No signs of Nausea or vomiting  Last Vitals:  Filed Vitals:   03/15/13 0922  BP: 125/86  Pulse: 76  Temp: 36.5 C  Resp: 10    Post-op Vital Signs: stable   Complications: No apparent anesthesia complications

## 2013-03-15 NOTE — Anesthesia Procedure Notes (Signed)
Procedure Name: LMA Insertion Date/Time: 03/15/2013 8:42 AM Performed by: Justice Rocher Pre-anesthesia Checklist: Patient identified, Emergency Drugs available, Suction available and Patient being monitored Patient Re-evaluated:Patient Re-evaluated prior to inductionOxygen Delivery Method: Circle System Utilized Preoxygenation: Pre-oxygenation with 100% oxygen Intubation Type: IV induction Ventilation: Mask ventilation without difficulty LMA: LMA inserted LMA Size: 4.0 Number of attempts: 1 Airway Equipment and Method: bite block Placement Confirmation: positive ETCO2 Tube secured with: Tape Dental Injury: Teeth and Oropharynx as per pre-operative assessment

## 2013-03-16 ENCOUNTER — Encounter (HOSPITAL_BASED_OUTPATIENT_CLINIC_OR_DEPARTMENT_OTHER): Payer: Self-pay | Admitting: Urology

## 2014-06-04 ENCOUNTER — Other Ambulatory Visit: Payer: Self-pay | Admitting: Obstetrics and Gynecology

## 2014-06-05 LAB — CYTOLOGY - PAP

## 2014-08-05 ENCOUNTER — Encounter: Payer: Self-pay | Admitting: Genetic Counselor

## 2015-05-09 DIAGNOSIS — Z6832 Body mass index (BMI) 32.0-32.9, adult: Secondary | ICD-10-CM | POA: Diagnosis not present

## 2015-05-09 DIAGNOSIS — E038 Other specified hypothyroidism: Secondary | ICD-10-CM | POA: Diagnosis not present

## 2015-05-09 DIAGNOSIS — E782 Mixed hyperlipidemia: Secondary | ICD-10-CM | POA: Diagnosis not present

## 2015-05-09 DIAGNOSIS — R5383 Other fatigue: Secondary | ICD-10-CM | POA: Diagnosis not present

## 2015-06-09 DIAGNOSIS — Z01419 Encounter for gynecological examination (general) (routine) without abnormal findings: Secondary | ICD-10-CM | POA: Diagnosis not present

## 2015-06-09 DIAGNOSIS — Z6833 Body mass index (BMI) 33.0-33.9, adult: Secondary | ICD-10-CM | POA: Diagnosis not present

## 2015-06-10 DIAGNOSIS — Z6831 Body mass index (BMI) 31.0-31.9, adult: Secondary | ICD-10-CM | POA: Diagnosis not present

## 2015-06-10 DIAGNOSIS — Z23 Encounter for immunization: Secondary | ICD-10-CM | POA: Diagnosis not present

## 2015-06-10 DIAGNOSIS — Z136 Encounter for screening for cardiovascular disorders: Secondary | ICD-10-CM | POA: Diagnosis not present

## 2015-06-10 DIAGNOSIS — Z0000 Encounter for general adult medical examination without abnormal findings: Secondary | ICD-10-CM | POA: Diagnosis not present

## 2015-06-11 DIAGNOSIS — R5383 Other fatigue: Secondary | ICD-10-CM | POA: Diagnosis not present

## 2015-06-11 DIAGNOSIS — E782 Mixed hyperlipidemia: Secondary | ICD-10-CM | POA: Diagnosis not present

## 2015-06-11 DIAGNOSIS — E038 Other specified hypothyroidism: Secondary | ICD-10-CM | POA: Diagnosis not present

## 2015-07-11 ENCOUNTER — Encounter: Payer: Self-pay | Admitting: Genetic Counselor

## 2015-09-23 DIAGNOSIS — E039 Hypothyroidism, unspecified: Secondary | ICD-10-CM | POA: Diagnosis not present

## 2015-09-25 DIAGNOSIS — Z23 Encounter for immunization: Secondary | ICD-10-CM | POA: Diagnosis not present

## 2015-09-25 DIAGNOSIS — E063 Autoimmune thyroiditis: Secondary | ICD-10-CM | POA: Diagnosis not present

## 2015-09-25 DIAGNOSIS — E038 Other specified hypothyroidism: Secondary | ICD-10-CM | POA: Diagnosis not present

## 2015-10-16 DIAGNOSIS — Z1231 Encounter for screening mammogram for malignant neoplasm of breast: Secondary | ICD-10-CM | POA: Diagnosis not present

## 2015-11-11 ENCOUNTER — Encounter: Payer: Self-pay | Admitting: Cardiology

## 2015-11-18 NOTE — Progress Notes (Signed)
     HPI: 50 year old female for evaluation of chest pain, palpitations and dyspnea.  In July the patient was close to the [facility] It was particularly hot. She had significant vigorous activity. As she was returning she developed dizziness, nausea, dyspnea and some chest tightness. Since that time she has noticed fatigue. Her thyroid has been checked by endocrinology and she is on appropriate doses of medications. She notes dyspnea on exertion more than usual. She occasionally has tightness in her chest with climbing stairs. She has occasional palpitations which is long-standing and previously diagnosed as PACs and PVCs.  Current Outpatient Prescriptions  Medication Sig Dispense Refill  . levothyroxine (SYNTHROID, LEVOTHROID) 112 MCG tablet Take 112 mcg by mouth daily before breakfast.    . RABEprazole (ACIPHEX) 20 MG tablet Take 20 mg by mouth as needed.      No current facility-administered medications for this visit.     Allergies  Allergen Reactions  . Shrimp [Shellfish Allergy] Hives     Past Medical History:  Diagnosis Date  . Frequency of urination   . GERD (gastroesophageal reflux disease)   . Hypothyroidism    CURRENTLY NO MEDS  . Right ureteral stone   . Urgency of urination     Past Surgical History:  Procedure Laterality Date  . HOLMIUM LASER APPLICATION Right [id number]   Procedure: HOLMIUM LASER APPLICATION;  Surgeon: Franchot Gallo, MD;  Location: [facility];  Service: Urology;  Laterality: Right;  . LAPAROSCOPIC CHOLECYSTECTOMY  05-06-2000  . URETEROSCOPY Right 03/15/2013   Procedure: URETEROSCOPY;  Surgeon: Franchot Gallo, MD;  Location: [facility];  Service: Urology;  Laterality: Right;    Social History   Social History  . Marital status: Married    Spouse name: N/A  . Number of children: 3  . Years of education: N/A   Occupational History  . Not on file.   Social History Main Topics  . Smoking status: Never  Smoker  . Smokeless tobacco: Never Used  . Alcohol use No  . Drug use: No  . Sexual activity: Not on file   Other Topics Concern  . Not on file   Social History Narrative  . No narrative on file    Family History  Problem Relation Age of Onset  . Diabetes Father   . Heart failure Father     ROS: no fevers or chills, productive cough, hemoptysis, dysphasia, odynophagia, melena, hematochezia, dysuria, hematuria, rash, seizure activity, orthopnea, PND, pedal edema, claudication. Remaining systems are negative.  Physical Exam:   Blood pressure (!) 148/90, pulse 83, height 5\' 4"  (1.626 m), weight 187 lb (84.8 kg).  General:  Well developed/well nourished in NAD Skin warm/dry Patient not depressed No peripheral clubbing Back-normal HEENT-normal/normal eyelids Neck supple/normal carotid upstroke bilaterally; no bruits; no JVD; no thyromegaly chest - CTA/ normal expansion CV - RRR/normal S1 and S2; no murmurs, rubs or gallops;  PMI nondisplaced Abdomen -NT/ND, no HSM, no mass, + bowel sounds, no bruit 2+ femoral pulses, no bruits Ext-no edema, chords, 2+ DP Neuro-grossly nonfocal  ECG -sinus rhythm at a rate of 83. No ST changes.  A/P  1 chest pain-symptoms are atypical. I will arrange an exercise treadmill stratification.   2 dyspnea-[place] Check d-dimer to screen for pulmonary embolus. Schedule echocardiogram to assess LV function.   3 Palpitations-long-standing. Recently diagnosed as PACs and PVCs. Not particularly bothersome.    Kirk Ruths, MD

## 2015-11-19 ENCOUNTER — Telehealth: Payer: Self-pay | Admitting: Cardiology

## 2015-11-19 NOTE — Telephone Encounter (Signed)
Received records from Dr Rachell Cipro for appointment on 11/25/15 with Dr Stanford Breed.  Records given to [facility] (medical records) for Dr Jacalyn Lefevre schedule on 11/25/15. lp

## 2015-11-19 NOTE — Telephone Encounter (Signed)
Patient came to office and signed Release to get records from Dr Rachell Cipro PCP) and Dr Legrand Como Altheimer for appointment on 11/25/15 with Dr Stanford Breed.  Faxed Release 11/19/15. lp

## 2015-11-20 ENCOUNTER — Telehealth: Payer: Self-pay | Admitting: Cardiology

## 2015-11-20 NOTE — Telephone Encounter (Signed)
Received records from [facility] for appointment on 11/25/15 with Dr Stanford Breed. Records given to [facility] (medical records) for Dr Jacalyn Lefevre schedule on 11/25/15. lp

## 2015-11-25 ENCOUNTER — Ambulatory Visit (INDEPENDENT_AMBULATORY_CARE_PROVIDER_SITE_OTHER): Payer: BLUE CROSS/BLUE SHIELD | Admitting: Cardiology

## 2015-11-25 ENCOUNTER — Encounter: Payer: Self-pay | Admitting: Cardiology

## 2015-11-25 VITALS — BP 148/90 | HR 83 | Ht 64.0 in | Wt 187.0 lb

## 2015-11-25 DIAGNOSIS — R002 Palpitations: Secondary | ICD-10-CM | POA: Diagnosis not present

## 2015-11-25 DIAGNOSIS — R072 Precordial pain: Secondary | ICD-10-CM

## 2015-11-25 DIAGNOSIS — R0602 Shortness of breath: Secondary | ICD-10-CM

## 2015-11-25 NOTE — Patient Instructions (Signed)
Medication Instructions:   NO CHANGE  Labwork:  Your physician recommends that you HAVE LAB WORK TODAY  Testing/Procedures:  Your physician has requested that you have an echocardiogram. Echocardiography is a painless test that uses sound waves to create images of your heart. It provides your doctor with information about the size and shape of your heart and how well your heart's chambers and valves are working. This procedure takes approximately one hour. There are no restrictions for this procedure.   Your physician has requested that you have an exercise tolerance test. For further information please visit [email or web address]. Please also follow instruction sheet, as given.    Follow-Up:  Your physician recommends that you schedule a follow-up appointment in: AS NEEDED PENDING TEST RESULTS   Exercise Stress Electrocardiogram An exercise stress electrocardiogram is a test to check how blood flows to your heart. It is done to find areas of poor blood flow. You will need to walk on a treadmill for this test. The electrocardiogram will record your heartbeat when you are at rest and when you are exercising. BEFORE THE PROCEDURE  Do not have drinks with caffeine or foods with caffeine for 24 hours before the test, or as told by your doctor. This includes coffee, tea (even decaf tea), sodas, chocolate, and cocoa.  Follow your doctor's instructions about eating and drinking before the test.  Ask your doctor what medicines you should or should not take before the test. Take your medicines with water unless told by your doctor not to.  If you use an inhaler, bring it with you to the test.  Bring a snack to eat after the test.  Do not  smoke for 4 hours before the test.  Do not put lotions, powders, creams, or oils on your chest before the test.  Wear comfortable shoes and clothing. PROCEDURE  You will have patches put on your chest. Small areas of your chest may need to be shaved.  Wires will be connected to the patches.  Your heart rate will be watched while you are resting and while you are exercising.  You will walk on the treadmill. The treadmill will slowly get faster to raise your heart rate.  The test will take about 1-2 hours. AFTER THE PROCEDURE  Your heart rate and blood pressure will be watched after the test.  You may return to your normal diet, activities, and medicines or as told by your doctor.   This information is not intended to replace advice given to you by your health care provider. Make sure you discuss any questions you have with your health care provider.   Document Released: 06/23/2007 Document Revised: 01/25/2014 Document Reviewed: 09/11/2012 Elsevier Interactive Patient Education [employer].

## 2015-11-26 LAB — D-DIMER, QUANTITATIVE: D-Dimer, Quant: 0.49 mcg/mL FEU (ref <0.50)

## 2015-11-27 ENCOUNTER — Telehealth (HOSPITAL_COMMUNITY): Payer: Self-pay

## 2015-11-27 NOTE — Telephone Encounter (Signed)
Encounter complete. 

## 2015-11-28 ENCOUNTER — Ambulatory Visit: Payer: Self-pay | Admitting: Cardiology

## 2015-12-02 ENCOUNTER — Inpatient Hospital Stay (HOSPITAL_COMMUNITY): Admission: RE | Admit: 2015-12-02 | Payer: BLUE CROSS/BLUE SHIELD | Source: Ambulatory Visit

## 2015-12-17 ENCOUNTER — Other Ambulatory Visit (HOSPITAL_COMMUNITY): Payer: BLUE CROSS/BLUE SHIELD

## 2015-12-22 ENCOUNTER — Ambulatory Visit (INDEPENDENT_AMBULATORY_CARE_PROVIDER_SITE_OTHER): Payer: BLUE CROSS/BLUE SHIELD | Admitting: Physician Assistant

## 2015-12-22 VITALS — HR 107 | Temp 98.9°F | Resp 17 | Ht 64.0 in | Wt 186.0 lb

## 2015-12-22 DIAGNOSIS — Z23 Encounter for immunization: Secondary | ICD-10-CM

## 2015-12-22 DIAGNOSIS — S61012A Laceration without foreign body of left thumb without damage to nail, initial encounter: Secondary | ICD-10-CM

## 2015-12-22 NOTE — Progress Notes (Signed)
   12/23/2015 9:33 AM   DOB: Jul 19, 1965 / MRN: [id number]  SUBJECTIVE:  Angie Manning is a 50 y.o. female presenting for a left medial distal thumb laceration sustained today while opening some boxes with a box cutter.  Has controlled the bleeding with pressure.  Reports there is a flap of skin.     She is allergic to shrimp [shellfish allergy].   She  has a past medical history of Frequency of urination; GERD (gastroesophageal reflux disease); Hypothyroidism; Right ureteral stone; and Urgency of urination.    She  reports that she has never smoked. She has never used smokeless tobacco. She reports that she does not drink alcohol or use drugs. She  has no sexual activity history on file. The patient  has a past surgical history that includes Laparoscopic cholecystectomy (05-06-2000); Ureteroscopy (Right, 03/15/2013); and Holmium laser application (Right, [id number]).  Her family history includes Diabetes in her father; Heart failure in her father.  Review of Systems  Musculoskeletal: Negative for joint pain and myalgias.  Neurological: Negative for dizziness, tingling, sensory change and focal weakness.    The problem list and medications were reviewed and updated by myself where necessary and exist elsewhere in the encounter.   OBJECTIVE:  Pulse (!) 107   Temp 98.9 F (37.2 C) (Oral)   Resp 17   Ht 5\' 4"  (1.626 m)   Wt 186 lb (84.4 kg)   LMP 12/15/2015   SpO2 100%   BMI 31.93 kg/m   Physical Exam  Constitutional: She is oriented to person, place, and time. She appears well-developed and well-nourished. No distress.  Cardiovascular: Normal rate and regular rhythm.   Pulmonary/Chest: Effort normal.  Musculoskeletal:       Hands: Neurological: She is alert and oriented to person, place, and time.  Skin: She is not diaphoretic.   Procedure: VC obtained. Patient anesthestetized using 5 cc of marciane via digital block.  Wound closed with topical adhesive and mupirocin  bandage placed.   No results found for this or any previous visit (from the past 72 hour(s)).  No results found.  ASSESSMENT AND PLAN  [name] was seen today for finger injury.  Diagnoses and all orders for this visit:  Laceration of left thumb without foreign body without damage to nail, initial encounter: Mild.  See procedure. Patient advised of infection signs and symptoms otherwise RTC as needed.   Need for Tdap vaccination -     Tdap vaccine greater than or equal to 7yo IM       The patient is advised to call or return to clinic if she does not see an improvement in symptoms, or to seek the care of the closest emergency department if she worsens with the above plan.   Philis Fendt, MHS, PA-C [facility] 12/23/2015 9:33 AM

## 2015-12-22 NOTE — Patient Instructions (Signed)
     IF you received an x-ray today, you will receive an invoice from [facility]. Please contact [facility] at [phone] with questions or concerns regarding your invoice.   IF you received labwork today, you will receive an invoice from [facility]. Please contact [facility] at [phone] with questions or concerns regarding your invoice.   Our billing staff will not be able to assist you with questions regarding bills from these companies.  You will be contacted with the lab results as soon as they are available. The fastest way to get your results is to activate your My Chart account. Instructions are located on the last page of this paperwork. If you have not heard from us regarding the results in 2 weeks, please contact this office.      

## 2015-12-26 ENCOUNTER — Inpatient Hospital Stay (HOSPITAL_COMMUNITY): Admission: RE | Admit: 2015-12-26 | Payer: BLUE CROSS/BLUE SHIELD | Source: Ambulatory Visit

## 2016-01-29 ENCOUNTER — Telehealth (HOSPITAL_COMMUNITY): Payer: Self-pay | Admitting: Cardiology

## 2016-01-29 NOTE — Telephone Encounter (Addendum)
I called pt and lmsg for her to CB to see if she wanted to reschedule her ETT and Echo that was cancelled back in November and December. ETT has already been cancelled twice.   Pt did not call back Friday afternoon(01/30/16) or Today(02/02/16) she will be removed from the workqueue. If she calls back wanting to rescheduled she will need to get an order for the ordering physician.

## 2016-03-15 DIAGNOSIS — E039 Hypothyroidism, unspecified: Secondary | ICD-10-CM | POA: Diagnosis not present

## 2016-03-19 DIAGNOSIS — E038 Other specified hypothyroidism: Secondary | ICD-10-CM | POA: Diagnosis not present

## 2016-03-19 DIAGNOSIS — E063 Autoimmune thyroiditis: Secondary | ICD-10-CM | POA: Diagnosis not present

## 2016-06-07 DIAGNOSIS — Z0000 Encounter for general adult medical examination without abnormal findings: Secondary | ICD-10-CM | POA: Diagnosis not present

## 2016-06-07 DIAGNOSIS — Z6832 Body mass index (BMI) 32.0-32.9, adult: Secondary | ICD-10-CM | POA: Diagnosis not present

## 2016-06-07 DIAGNOSIS — Z13220 Encounter for screening for lipoid disorders: Secondary | ICD-10-CM | POA: Diagnosis not present

## 2016-06-07 DIAGNOSIS — Z23 Encounter for immunization: Secondary | ICD-10-CM | POA: Diagnosis not present

## 2016-06-09 DIAGNOSIS — Z01419 Encounter for gynecological examination (general) (routine) without abnormal findings: Secondary | ICD-10-CM | POA: Diagnosis not present

## 2016-06-09 DIAGNOSIS — Z6832 Body mass index (BMI) 32.0-32.9, adult: Secondary | ICD-10-CM | POA: Diagnosis not present

## 2016-09-15 DIAGNOSIS — E038 Other specified hypothyroidism: Secondary | ICD-10-CM | POA: Diagnosis not present

## 2016-09-15 DIAGNOSIS — E063 Autoimmune thyroiditis: Secondary | ICD-10-CM | POA: Diagnosis not present

## 2016-09-16 DIAGNOSIS — D225 Melanocytic nevi of trunk: Secondary | ICD-10-CM | POA: Diagnosis not present

## 2016-09-16 DIAGNOSIS — L821 Other seborrheic keratosis: Secondary | ICD-10-CM | POA: Diagnosis not present

## 2016-09-24 DIAGNOSIS — Z23 Encounter for immunization: Secondary | ICD-10-CM | POA: Diagnosis not present

## 2016-09-24 DIAGNOSIS — E063 Autoimmune thyroiditis: Secondary | ICD-10-CM | POA: Diagnosis not present

## 2016-09-24 DIAGNOSIS — E038 Other specified hypothyroidism: Secondary | ICD-10-CM | POA: Diagnosis not present

## 2016-12-03 DIAGNOSIS — L989 Disorder of the skin and subcutaneous tissue, unspecified: Secondary | ICD-10-CM | POA: Diagnosis not present

## 2016-12-03 DIAGNOSIS — L309 Dermatitis, unspecified: Secondary | ICD-10-CM | POA: Diagnosis not present

## 2016-12-06 DIAGNOSIS — T753XXA Motion sickness, initial encounter: Secondary | ICD-10-CM | POA: Diagnosis not present

## 2016-12-22 DIAGNOSIS — Z6831 Body mass index (BMI) 31.0-31.9, adult: Secondary | ICD-10-CM | POA: Diagnosis not present

## 2016-12-22 DIAGNOSIS — L989 Disorder of the skin and subcutaneous tissue, unspecified: Secondary | ICD-10-CM | POA: Diagnosis not present

## 2016-12-22 DIAGNOSIS — A4902 Methicillin resistant Staphylococcus aureus infection, unspecified site: Secondary | ICD-10-CM | POA: Diagnosis not present

## 2016-12-22 DIAGNOSIS — L299 Pruritus, unspecified: Secondary | ICD-10-CM | POA: Diagnosis not present

## 2016-12-31 DIAGNOSIS — K219 Gastro-esophageal reflux disease without esophagitis: Secondary | ICD-10-CM | POA: Diagnosis not present

## 2016-12-31 DIAGNOSIS — Z6832 Body mass index (BMI) 32.0-32.9, adult: Secondary | ICD-10-CM | POA: Diagnosis not present

## 2016-12-31 DIAGNOSIS — A4902 Methicillin resistant Staphylococcus aureus infection, unspecified site: Secondary | ICD-10-CM | POA: Diagnosis not present

## 2016-12-31 DIAGNOSIS — L989 Disorder of the skin and subcutaneous tissue, unspecified: Secondary | ICD-10-CM | POA: Diagnosis not present

## 2017-03-21 DIAGNOSIS — E063 Autoimmune thyroiditis: Secondary | ICD-10-CM | POA: Diagnosis not present

## 2017-03-21 DIAGNOSIS — E038 Other specified hypothyroidism: Secondary | ICD-10-CM | POA: Diagnosis not present

## 2017-03-24 DIAGNOSIS — E063 Autoimmune thyroiditis: Secondary | ICD-10-CM | POA: Diagnosis not present

## 2017-03-24 DIAGNOSIS — E038 Other specified hypothyroidism: Secondary | ICD-10-CM | POA: Diagnosis not present

## 2017-04-13 DIAGNOSIS — J309 Allergic rhinitis, unspecified: Secondary | ICD-10-CM | POA: Diagnosis not present

## 2017-04-13 DIAGNOSIS — Z6832 Body mass index (BMI) 32.0-32.9, adult: Secondary | ICD-10-CM | POA: Diagnosis not present

## 2017-04-13 DIAGNOSIS — J069 Acute upper respiratory infection, unspecified: Secondary | ICD-10-CM | POA: Diagnosis not present

## 2017-04-13 DIAGNOSIS — R070 Pain in throat: Secondary | ICD-10-CM | POA: Diagnosis not present

## 2017-04-15 DIAGNOSIS — L300 Nummular dermatitis: Secondary | ICD-10-CM | POA: Diagnosis not present

## 2017-04-15 DIAGNOSIS — L309 Dermatitis, unspecified: Secondary | ICD-10-CM | POA: Diagnosis not present

## 2017-04-22 DIAGNOSIS — R799 Abnormal finding of blood chemistry, unspecified: Secondary | ICD-10-CM | POA: Diagnosis not present

## 2017-04-25 DIAGNOSIS — L239 Allergic contact dermatitis, unspecified cause: Secondary | ICD-10-CM | POA: Diagnosis not present

## 2017-04-28 DIAGNOSIS — L259 Unspecified contact dermatitis, unspecified cause: Secondary | ICD-10-CM | POA: Diagnosis not present

## 2017-05-24 DIAGNOSIS — R945 Abnormal results of liver function studies: Secondary | ICD-10-CM | POA: Diagnosis not present

## 2017-06-17 ENCOUNTER — Other Ambulatory Visit (HOSPITAL_COMMUNITY): Payer: Self-pay | Admitting: Gastroenterology

## 2017-06-17 DIAGNOSIS — R945 Abnormal results of liver function studies: Secondary | ICD-10-CM

## 2017-06-17 DIAGNOSIS — R7989 Other specified abnormal findings of blood chemistry: Secondary | ICD-10-CM

## 2017-06-17 DIAGNOSIS — R5382 Chronic fatigue, unspecified: Secondary | ICD-10-CM | POA: Diagnosis not present

## 2017-06-17 DIAGNOSIS — R748 Abnormal levels of other serum enzymes: Secondary | ICD-10-CM | POA: Diagnosis not present

## 2017-06-17 DIAGNOSIS — L299 Pruritus, unspecified: Secondary | ICD-10-CM | POA: Diagnosis not present

## 2017-06-20 DIAGNOSIS — Z1231 Encounter for screening mammogram for malignant neoplasm of breast: Secondary | ICD-10-CM | POA: Diagnosis not present

## 2017-06-20 DIAGNOSIS — Z01419 Encounter for gynecological examination (general) (routine) without abnormal findings: Secondary | ICD-10-CM | POA: Diagnosis not present

## 2017-06-20 DIAGNOSIS — Z6833 Body mass index (BMI) 33.0-33.9, adult: Secondary | ICD-10-CM | POA: Diagnosis not present

## 2017-06-20 DIAGNOSIS — Z800 Family history of malignant neoplasm of digestive organs: Secondary | ICD-10-CM | POA: Diagnosis not present

## 2017-06-20 DIAGNOSIS — Z85820 Personal history of malignant melanoma of skin: Secondary | ICD-10-CM | POA: Diagnosis not present

## 2017-06-20 DIAGNOSIS — Z803 Family history of malignant neoplasm of breast: Secondary | ICD-10-CM | POA: Diagnosis not present

## 2017-06-28 ENCOUNTER — Other Ambulatory Visit: Payer: Self-pay | Admitting: Radiology

## 2017-06-29 ENCOUNTER — Encounter (HOSPITAL_COMMUNITY): Payer: Self-pay

## 2017-06-29 ENCOUNTER — Ambulatory Visit (HOSPITAL_COMMUNITY)
Admission: RE | Admit: 2017-06-29 | Discharge: 2017-06-29 | Disposition: A | Discharge status: Home / Self Care | Payer: BLUE CROSS/BLUE SHIELD | Source: Ambulatory Visit | Attending: Gastroenterology | Admitting: Gastroenterology

## 2017-06-29 DIAGNOSIS — R932 Abnormal findings on diagnostic imaging of liver and biliary tract: Secondary | ICD-10-CM | POA: Diagnosis not present

## 2017-06-29 DIAGNOSIS — R740 Nonspecific elevation of levels of transaminase and lactic acid dehydrogenase [LDH]: Secondary | ICD-10-CM | POA: Insufficient documentation

## 2017-06-29 DIAGNOSIS — K760 Fatty (change of) liver, not elsewhere classified: Secondary | ICD-10-CM | POA: Diagnosis not present

## 2017-06-29 DIAGNOSIS — R945 Abnormal results of liver function studies: Secondary | ICD-10-CM

## 2017-06-29 DIAGNOSIS — R7989 Other specified abnormal findings of blood chemistry: Secondary | ICD-10-CM

## 2017-06-29 LAB — CBC
HCT: 39.7 % (ref 36.0–46.0)
HEMOGLOBIN: 12.5 g/dL (ref 12.0–15.0)
MCH: 26.9 pg (ref 26.0–34.0)
MCHC: 31.5 g/dL (ref 30.0–36.0)
MCV: 85.4 fL (ref 78.0–100.0)
Platelets: 321 10*3/uL (ref 150–400)
RBC: 4.65 MIL/uL (ref 3.87–5.11)
RDW: 13 % (ref 11.5–15.5)
WBC: 6 10*3/uL (ref 4.0–10.5)

## 2017-06-29 LAB — PREGNANCY, URINE: Preg Test, Ur: NEGATIVE

## 2017-06-29 LAB — PROTIME-INR
INR: 0.93
PROTHROMBIN TIME: 12.4 s (ref 11.4–15.2)

## 2017-06-29 MED ORDER — FENTANYL CITRATE (PF) 100 MCG/2ML IJ SOLN
INTRAMUSCULAR | Status: AC
  Filled 2017-06-29: qty 2

## 2017-06-29 MED ORDER — MIDAZOLAM HCL 2 MG/2ML IJ SOLN
INTRAMUSCULAR | Status: AC
  Filled 2017-06-29: qty 2

## 2017-06-29 MED ORDER — GELATIN ABSORBABLE 12-7 MM EX MISC
CUTANEOUS | Status: DC
Start: 2017-06-29 — End: 2017-06-30
  Filled 2017-06-29: qty 1

## 2017-06-29 MED ORDER — FENTANYL CITRATE (PF) 100 MCG/2ML IJ SOLN
INTRAMUSCULAR | Status: AC | PRN
  Administered 2017-06-29 (×2): 50 ug via INTRAVENOUS

## 2017-06-29 MED ORDER — SODIUM CHLORIDE 0.9 % IV SOLN: INTRAVENOUS | Status: DC

## 2017-06-29 MED ORDER — LIDOCAINE HCL (PF) 1 % IJ SOLN
INTRAMUSCULAR | Status: AC
  Filled 2017-06-29: qty 30

## 2017-06-29 MED ORDER — MIDAZOLAM HCL 2 MG/2ML IJ SOLN
INTRAMUSCULAR | Status: AC | PRN
  Administered 2017-06-29 (×2): 1 mg via INTRAVENOUS

## 2017-06-29 NOTE — Discharge Instructions (Signed)
**Note Angie Manning-identified via Obfuscation** Liver Biopsy, Care After °These instructions give you information on caring for yourself after your procedure. Your doctor may also give you more specific instructions. Call your doctor if you have any problems or questions after your procedure. °Follow these instructions at home: °· Rest at home for 1-2 days or as told by your doctor. °· Have someone stay with you for at least 24 hours. °· Do not do these things in the first 24 hours: °? Drive. °? Use machinery. °? Take care of other people. °? Sign legal documents. °? Take a bath or shower. °· There are many different ways to close and cover a cut (incision). For example, a cut can be closed with stitches, skin glue, or adhesive strips. Follow your doctor's instructions on: °? Taking care of your cut. °? Changing and removing your bandage (dressing). °? Removing whatever was used to close your cut. °· Do not drink alcohol in the first week. °· Do not lift more than 5 pounds or play contact sports for the first 2 weeks. °· Take medicines only as told by your doctor. For 1 week, do not take medicine that has aspirin in it or medicines like ibuprofen. °· Get your test results. °Contact a doctor if: °· A cut bleeds and leaves more than just a small spot of blood. °· A cut is red, puffs up (swells), or hurts more than before. °· Fluid or something else comes from a cut. °· A cut smells bad. °· You have a fever or chills. °Get help right away if: °· You have swelling, bloating, or pain in your belly (abdomen). °· You get dizzy or faint. °· You have a rash. °· You feel sick to your stomach (nauseous) or throw up (vomit). °· You have trouble breathing, feel short of breath, or feel faint. °· Your chest hurts. °· You have problems talking or seeing. °· You have trouble balancing or moving your arms or legs. °This information is not intended to replace advice given to you by your health care provider. Make sure you discuss any questions you have with your health care  provider. °Document Released: 10/14/2007 Document Revised: 06/12/2015 Document Reviewed: 03/02/2013 °Elsevier Interactive Patient Education © 2018 Elsevier Inc. ° °

## 2017-06-29 NOTE — Progress Notes (Signed)
**Note Jaccob Czaplicki-Identified via Obfuscation** Urine pregnancy sample sent to lab, results pending. Radiology department nurse, Loree Fee, notified about pending results around 1305.

## 2017-06-29 NOTE — Procedures (Signed)
Interventional Radiology Procedure Note  Procedure: US guided medical liver bx. Mx bx's. .  Complications: None Recommendations:  - Ok to shower tomorrow - 2 hour DC home - Do not submerge for 7 days - Routine wound care   Signed,  Dulcy Fanny. Earleen Newport, DO

## 2017-06-29 NOTE — H&P (Signed)
Chief Complaint: Patient was seen in consultation today for random liver biopsy at the request of Ronnette Juniper  Referring Physician(s): Ronnette Juniper  Supervising Physician: Corrie Mckusick  Patient Status: [facility] - Out-pt  History of Present Illness: Angie Manning is a 52 y.o. female   Pruritus began in Oct 2018 Worsened for few months MD checked Blood work several times Liver functions have been elevated but stable Referred to Dr Therisa Doyne  Scheduled now for random liver biopsy Rule out Primary Biliary Cirrhosis   Past Medical History:  Diagnosis Date  . Frequency of urination   . GERD (gastroesophageal reflux disease)   . Hypothyroidism    CURRENTLY NO MEDS  . Right ureteral stone   . Urgency of urination     Past Surgical History:  Procedure Laterality Date  . HOLMIUM LASER APPLICATION Right 3/84/6659   Procedure: HOLMIUM LASER APPLICATION;  Surgeon: Franchot Gallo, MD;  Location: [facility];  Service: Urology;  Laterality: Right;  . LAPAROSCOPIC CHOLECYSTECTOMY  05-06-2000  . URETEROSCOPY Right 03/15/2013   Procedure: URETEROSCOPY;  Surgeon: Franchot Gallo, MD;  Location: [facility];  Service: Urology;  Laterality: Right;    Allergies: Shrimp [shellfish allergy]  Medications: Prior to Admission medications   Medication Sig Start Date End Date Taking? Authorizing Provider  levothyroxine (SYNTHROID, LEVOTHROID) 125 MCG tablet Take 125 mcg by mouth daily before breakfast.    Yes [provider]  RABEprazole (ACIPHEX) 20 MG tablet Take 20 mg by mouth 2 (two) times daily as needed (for acid reflux).    Yes [provider]  ursodiol (ACTIGALL) 500 MG tablet Take 500 mg by mouth 3 (three) times daily. 06/17/17  Yes [provider]     Family History  Problem Relation Age of Onset  . Diabetes Father   . Heart failure Father     Social History   Socioeconomic History  . Marital status: Married   Spouse name: Not on file  . Number of children: 3  . Years of education: Not on file  . Highest education level: Not on file  Occupational History  . Not on file  Social Needs  . Financial resource strain: Not on file  . Food insecurity:    Worry: Not on file    Inability: Not on file  . Transportation needs:    Medical: Not on file    Non-medical: Not on file  Tobacco Use  . Smoking status: Never Smoker  . Smokeless tobacco: Never Used  Substance and Sexual Activity  . Alcohol use: No  . Drug use: No  . Sexual activity: Not on file  Lifestyle  . Physical activity:    Days per week: Not on file    Minutes per session: Not on file  . Stress: Not on file  Relationships  . Social connections:    Talks on phone: Not on file    Gets together: Not on file    Attends religious service: Not on file    Active member of club or organization: Not on file    Attends meetings of clubs or organizations: Not on file    Relationship status: Not on file  Other Topics Concern  . Not on file  Social History Narrative  . Not on file    Review of Systems: A 12 point ROS discussed and pertinent positives are indicated in the HPI above.  All other systems are negative.  Review of Systems  Constitutional: Negative for activity change,  appetite change, fatigue and fever.  Respiratory: Negative for cough and shortness of breath.   Cardiovascular: Negative for chest pain.  Gastrointestinal: Negative for abdominal pain.  Skin:       Pruritus   Neurological: Negative for weakness.  Psychiatric/Behavioral: Negative for behavioral problems and confusion.    Vital Signs: BP (!) 153/101 (BP Location: Right Arm)   Pulse 95   Temp 98.5 F (36.9 C) (Oral)   Ht 5\' 4"  (1.626 m)   Wt 189 lb (85.7 kg)   SpO2 99%   BMI 32.44 kg/m   Physical Exam  Constitutional: She is oriented to person, place, and time.  Cardiovascular: Normal rate, regular rhythm and normal heart sounds.    Pulmonary/Chest: Effort normal and breath sounds normal.  Abdominal: Soft. Bowel sounds are normal.  Musculoskeletal: Normal range of motion.  Neurological: She is oriented to person, place, and time.  Skin: Skin is warm and dry.  Psychiatric: She has a normal mood and affect. Her behavior is normal. Judgment and thought content normal.  Nursing note and vitals reviewed.   Imaging: No results found.  Labs:  CBC: No results for input(s): WBC, HGB, HCT, PLT in the last 8760 hours.  COAGS: No results for input(s): INR, APTT in the last 8760 hours.  BMP: No results for input(s): NA, K, CL, CO2, GLUCOSE, BUN, CALCIUM, CREATININE, GFRNONAA, GFRAA in the last 8760 hours.  Invalid input(s): CMP  LIVER FUNCTION TESTS: No results for input(s): BILITOT, AST, ALT, ALKPHOS, PROT, ALBUMIN in the last 8760 hours.  TUMOR MARKERS: No results for input(s): AFPTM, CEA, CA199, CHROMGRNA in the last 8760 hours.  Assessment and Plan:  Pruritus x 6 mo Elevated liver functions - serial findings Referral to Dr Therisa Doyne Now for liver biopsy-- to rule out Primary Biliary Cirrhosis Risks and benefits discussed with the patient including, but not limited to bleeding, infection, damage to adjacent structures or low yield requiring additional tests.  All of the patient's questions were answered, patient is agreeable to proceed. Consent signed and in chart.   Thank you for this interesting consult.  I greatly enjoyed meeting Luara E Cranor-Cheek and look forward to participating in their care.  A copy of this report was sent to the requesting provider on this date.  Electronically Signed: Lavonia Drafts, PA-C 06/29/2017, 12:14 PM   I spent a total of  30 Minutes   in face to face in clinical consultation, greater than 50% of which was counseling/coordinating care for random liver bx

## 2017-09-13 DIAGNOSIS — R748 Abnormal levels of other serum enzymes: Secondary | ICD-10-CM | POA: Diagnosis not present

## 2017-09-13 DIAGNOSIS — L299 Pruritus, unspecified: Secondary | ICD-10-CM | POA: Diagnosis not present

## 2017-09-20 DIAGNOSIS — Z809 Family history of malignant neoplasm, unspecified: Secondary | ICD-10-CM | POA: Diagnosis not present

## 2017-09-22 DIAGNOSIS — E038 Other specified hypothyroidism: Secondary | ICD-10-CM | POA: Diagnosis not present

## 2017-09-22 DIAGNOSIS — E063 Autoimmune thyroiditis: Secondary | ICD-10-CM | POA: Diagnosis not present

## 2017-09-29 DIAGNOSIS — E038 Other specified hypothyroidism: Secondary | ICD-10-CM | POA: Diagnosis not present

## 2017-09-29 DIAGNOSIS — E063 Autoimmune thyroiditis: Secondary | ICD-10-CM | POA: Diagnosis not present

## 2017-10-17 DIAGNOSIS — J0100 Acute maxillary sinusitis, unspecified: Secondary | ICD-10-CM | POA: Diagnosis not present

## 2017-10-17 DIAGNOSIS — Z6831 Body mass index (BMI) 31.0-31.9, adult: Secondary | ICD-10-CM | POA: Diagnosis not present

## 2017-10-25 ENCOUNTER — Ambulatory Visit: Payer: BLUE CROSS/BLUE SHIELD | Admitting: Allergy and Immunology

## 2018-03-22 DIAGNOSIS — E038 Other specified hypothyroidism: Secondary | ICD-10-CM | POA: Diagnosis not present

## 2018-03-22 DIAGNOSIS — E063 Autoimmune thyroiditis: Secondary | ICD-10-CM | POA: Diagnosis not present

## 2018-03-30 DIAGNOSIS — E063 Autoimmune thyroiditis: Secondary | ICD-10-CM | POA: Diagnosis not present

## 2018-07-27 DIAGNOSIS — R319 Hematuria, unspecified: Secondary | ICD-10-CM | POA: Diagnosis not present

## 2018-07-27 DIAGNOSIS — N390 Urinary tract infection, site not specified: Secondary | ICD-10-CM | POA: Diagnosis not present

## 2018-08-10 DIAGNOSIS — N209 Urinary calculus, unspecified: Secondary | ICD-10-CM | POA: Diagnosis not present

## 2018-08-10 DIAGNOSIS — N201 Calculus of ureter: Secondary | ICD-10-CM | POA: Diagnosis not present

## 2018-08-14 DIAGNOSIS — N201 Calculus of ureter: Secondary | ICD-10-CM | POA: Diagnosis not present

## 2018-08-18 ENCOUNTER — Ambulatory Visit (HOSPITAL_BASED_OUTPATIENT_CLINIC_OR_DEPARTMENT_OTHER)
Admission: RE | Admit: 2018-08-18 | Discharge: 2018-08-18 | Disposition: A | Discharge status: Home / Self Care | Payer: BC Managed Care – PPO | Source: Ambulatory Visit | Attending: Urology | Admitting: Urology

## 2018-08-18 ENCOUNTER — Encounter (HOSPITAL_BASED_OUTPATIENT_CLINIC_OR_DEPARTMENT_OTHER)
Admission: RE | Disposition: A | Discharge status: Home / Self Care | Payer: Self-pay | Source: Ambulatory Visit | Attending: Urology

## 2018-08-18 ENCOUNTER — Other Ambulatory Visit: Payer: Self-pay | Admitting: Urology

## 2018-08-18 ENCOUNTER — Other Ambulatory Visit (HOSPITAL_COMMUNITY)
Admission: RE | Admit: 2018-08-18 | Discharge: 2018-08-18 | Disposition: A | Discharge status: Home / Self Care | Payer: BC Managed Care – PPO | Source: Ambulatory Visit | Attending: Urology | Admitting: Urology

## 2018-08-18 ENCOUNTER — Other Ambulatory Visit (HOSPITAL_COMMUNITY): Payer: Self-pay

## 2018-08-18 ENCOUNTER — Ambulatory Visit (HOSPITAL_BASED_OUTPATIENT_CLINIC_OR_DEPARTMENT_OTHER): Payer: BC Managed Care – PPO | Admitting: Certified Registered"

## 2018-08-18 ENCOUNTER — Encounter (HOSPITAL_BASED_OUTPATIENT_CLINIC_OR_DEPARTMENT_OTHER): Payer: Self-pay

## 2018-08-18 DIAGNOSIS — N200 Calculus of kidney: Secondary | ICD-10-CM | POA: Diagnosis not present

## 2018-08-18 DIAGNOSIS — N201 Calculus of ureter: Secondary | ICD-10-CM | POA: Diagnosis not present

## 2018-08-18 DIAGNOSIS — E039 Hypothyroidism, unspecified: Secondary | ICD-10-CM | POA: Insufficient documentation

## 2018-08-18 DIAGNOSIS — R3121 Asymptomatic microscopic hematuria: Secondary | ICD-10-CM | POA: Diagnosis not present

## 2018-08-18 DIAGNOSIS — R8271 Bacteriuria: Secondary | ICD-10-CM | POA: Diagnosis not present

## 2018-08-18 DIAGNOSIS — Z79890 Hormone replacement therapy (postmenopausal): Secondary | ICD-10-CM | POA: Diagnosis not present

## 2018-08-18 DIAGNOSIS — K219 Gastro-esophageal reflux disease without esophagitis: Secondary | ICD-10-CM | POA: Insufficient documentation

## 2018-08-18 DIAGNOSIS — Z20828 Contact with and (suspected) exposure to other viral communicable diseases: Secondary | ICD-10-CM | POA: Diagnosis not present

## 2018-08-18 DIAGNOSIS — R109 Unspecified abdominal pain: Secondary | ICD-10-CM | POA: Diagnosis not present

## 2018-08-18 HISTORY — PX: CYSTOSCOPY/URETEROSCOPY/HOLMIUM LASER/STENT PLACEMENT: SHX6546

## 2018-08-18 LAB — POCT PREGNANCY, URINE: Preg Test, Ur: NEGATIVE

## 2018-08-18 LAB — SARS CORONAVIRUS 2 BY RT PCR (HOSPITAL ORDER, PERFORMED IN ~~LOC~~ HOSPITAL LAB): SARS Coronavirus 2: NEGATIVE

## 2018-08-18 SURGERY — CYSTOSCOPY/URETEROSCOPY/HOLMIUM LASER/STENT PLACEMENT
Anesthesia: General | Site: Renal | Laterality: Right

## 2018-08-18 MED ORDER — KETOROLAC TROMETHAMINE 30 MG/ML IJ SOLN
INTRAMUSCULAR | Status: AC
  Filled 2018-08-18: qty 1

## 2018-08-18 MED ORDER — FENTANYL CITRATE (PF) 100 MCG/2ML IJ SOLN
25.0000 ug | INTRAMUSCULAR | Status: DC | PRN
  Administered 2018-08-18: 50 ug via INTRAVENOUS
  Filled 2018-08-18: qty 1

## 2018-08-18 MED ORDER — HYDROCODONE-ACETAMINOPHEN 5-325 MG PO TABS: 1.0000 | ORAL_TABLET | ORAL | 0 refills | Status: DC | PRN

## 2018-08-18 MED ORDER — CEFAZOLIN SODIUM-DEXTROSE 2-4 GM/100ML-% IV SOLN
INTRAVENOUS | Status: AC
  Filled 2018-08-18: qty 100

## 2018-08-18 MED ORDER — PROMETHAZINE HCL 25 MG/ML IJ SOLN
INTRAMUSCULAR | Status: AC
  Filled 2018-08-18: qty 1

## 2018-08-18 MED ORDER — FENTANYL CITRATE (PF) 100 MCG/2ML IJ SOLN
INTRAMUSCULAR | Status: DC | PRN
  Administered 2018-08-18: 50 ug via INTRAVENOUS

## 2018-08-18 MED ORDER — DEXAMETHASONE SODIUM PHOSPHATE 10 MG/ML IJ SOLN
INTRAMUSCULAR | Status: DC | PRN
  Administered 2018-08-18: 5 mg via INTRAVENOUS

## 2018-08-18 MED ORDER — MIDAZOLAM HCL 2 MG/2ML IJ SOLN
INTRAMUSCULAR | Status: DC | PRN
  Administered 2018-08-18: 2 mg via INTRAVENOUS

## 2018-08-18 MED ORDER — OXYCODONE HCL 5 MG/5ML PO SOLN
5.0000 mg | Freq: Once | ORAL | Status: DC | PRN
  Filled 2018-08-18: qty 5

## 2018-08-18 MED ORDER — MIDAZOLAM HCL 2 MG/2ML IJ SOLN
INTRAMUSCULAR | Status: AC
  Filled 2018-08-18: qty 2

## 2018-08-18 MED ORDER — PROMETHAZINE HCL 25 MG/ML IJ SOLN
6.2500 mg | Freq: Once | INTRAMUSCULAR | Status: AC
  Administered 2018-08-18: 6.25 mg via INTRAVENOUS
  Filled 2018-08-18: qty 1

## 2018-08-18 MED ORDER — LACTATED RINGERS IV SOLN
INTRAVENOUS | Status: DC
  Administered 2018-08-18 (×2): via INTRAVENOUS
  Filled 2018-08-18: qty 1000

## 2018-08-18 MED ORDER — PROPOFOL 10 MG/ML IV BOLUS
INTRAVENOUS | Status: AC
  Filled 2018-08-18: qty 20

## 2018-08-18 MED ORDER — PROPOFOL 10 MG/ML IV BOLUS
INTRAVENOUS | Status: DC | PRN
  Administered 2018-08-18: 200 mg via INTRAVENOUS

## 2018-08-18 MED ORDER — CEFAZOLIN SODIUM-DEXTROSE 2-4 GM/100ML-% IV SOLN
2.0000 g | INTRAVENOUS | Status: AC
  Administered 2018-08-18: 2 g via INTRAVENOUS
  Filled 2018-08-18: qty 100

## 2018-08-18 MED ORDER — ONDANSETRON HCL 4 MG/2ML IJ SOLN
INTRAMUSCULAR | Status: AC
  Filled 2018-08-18: qty 2

## 2018-08-18 MED ORDER — FENTANYL CITRATE (PF) 100 MCG/2ML IJ SOLN
INTRAMUSCULAR | Status: AC
  Filled 2018-08-18: qty 2

## 2018-08-18 MED ORDER — KETOROLAC TROMETHAMINE 30 MG/ML IJ SOLN
INTRAMUSCULAR | Status: DC | PRN
  Administered 2018-08-18: 30 mg via INTRAVENOUS

## 2018-08-18 MED ORDER — ONDANSETRON HCL 4 MG/2ML IJ SOLN
4.0000 mg | Freq: Four times a day (QID) | INTRAMUSCULAR | Status: AC | PRN
  Administered 2018-08-18: 4 mg via INTRAVENOUS
  Filled 2018-08-18: qty 2

## 2018-08-18 MED ORDER — WHITE PETROLATUM EX OINT
TOPICAL_OINTMENT | CUTANEOUS | Status: AC
  Filled 2018-08-18: qty 5

## 2018-08-18 MED ORDER — OXYCODONE HCL 5 MG PO TABS
5.0000 mg | ORAL_TABLET | Freq: Once | ORAL | Status: DC | PRN
  Filled 2018-08-18: qty 1

## 2018-08-18 MED ORDER — ONDANSETRON HCL 4 MG/2ML IJ SOLN
INTRAMUSCULAR | Status: DC | PRN
  Administered 2018-08-18: 4 mg via INTRAVENOUS

## 2018-08-18 MED ORDER — DEXAMETHASONE SODIUM PHOSPHATE 10 MG/ML IJ SOLN
INTRAMUSCULAR | Status: AC
  Filled 2018-08-18: qty 1

## 2018-08-18 MED ORDER — LIDOCAINE 2% (20 MG/ML) 5 ML SYRINGE
INTRAMUSCULAR | Status: DC | PRN
  Administered 2018-08-18: 60 mg via INTRAVENOUS

## 2018-08-18 MED ORDER — LIDOCAINE 2% (20 MG/ML) 5 ML SYRINGE
INTRAMUSCULAR | Status: AC
  Filled 2018-08-18: qty 5

## 2018-08-18 MED ORDER — SODIUM CHLORIDE 0.9 % IR SOLN
Status: DC | PRN
  Administered 2018-08-18: 3000 mL via INTRAVESICAL

## 2018-08-18 SURGICAL SUPPLY — 23 items
BAG DRAIN URO-CYSTO SKYTR STRL (DRAIN) ×2 IMPLANT
BAG DRN UROCATH (DRAIN) ×1
BASKET ZERO TIP NITINOL 2.4FR (BASKET) ×2 IMPLANT
CATH INTERMIT  6FR 70CM (CATHETERS) IMPLANT
CLOTH BEACON ORANGE TIMEOUT ST (SAFETY) ×2 IMPLANT
FIBER LASER FLEXIVA 365 (UROLOGICAL SUPPLIES) IMPLANT
FIBER LASER TRAC TIP (UROLOGICAL SUPPLIES) IMPLANT
GLOVE BIO SURGEON STRL SZ7.5 (GLOVE) ×2 IMPLANT
GLOVE BIOGEL PI IND STRL 8.5 (GLOVE) ×2 IMPLANT
GLOVE BIOGEL PI INDICATOR 8.5 (GLOVE) ×2
GLOVE INDICATOR 8.5 STRL (GLOVE) ×2 IMPLANT
GOWN STRL REUS W/TWL XL LVL3 (GOWN DISPOSABLE) IMPLANT
GUIDEWIRE STR DUAL SENSOR (WIRE) ×2 IMPLANT
IV NS 1000ML (IV SOLUTION) ×2
IV NS 1000ML BAXH (IV SOLUTION) ×1 IMPLANT
IV NS IRRIG 3000ML ARTHROMATIC (IV SOLUTION) ×2 IMPLANT
KIT TURNOVER CYSTO (KITS) ×2 IMPLANT
MANIFOLD NEPTUNE II (INSTRUMENTS) ×2 IMPLANT
NS IRRIG 500ML POUR BTL (IV SOLUTION) ×4 IMPLANT
PACK CYSTO (CUSTOM PROCEDURE TRAY) ×2 IMPLANT
STENT URET 6FRX24 CONTOUR (STENTS) ×2 IMPLANT
TUBE CONNECTING 12X1/4 (SUCTIONS) IMPLANT
TUBING UROLOGY SET (TUBING) IMPLANT

## 2018-08-18 NOTE — Discharge Instructions (Addendum)
[facility] [phone] Post Ureteroscopy With or Without Stent Instructions  Definitions:  Ureter: The duct that transports urine from the kidney to the bladder. Stent:   A plastic hollow tube that is placed into the ureter, from the kidney to the  bladder to prevent the ureter from swelling shut.  GENERAL INSTRUCTIONS:  Despite the fact that no skin incisions were used, the area around the ureter and bladder is raw and irritated. The stent is a foreign body which will further irritate the bladder wall. This irritation is manifested by increased frequency of urination, both day and night, and by an increase in the urge to urinate. In some, the urge to urinate is present almost always. Sometimes the urge is strong enough that you may not be able to stop yourself from urinating. The only real cure is to remove the stent and then give time for the bladder wall to heal which can't be done until the danger of the ureter swelling shut has passed, which varies.  You may see some blood in your urine while the stent is in place and a few days afterwards. Do not be alarmed, even if the urine was clear for a while. Get off your feet and drink lots of fluids until clearing occurs. If you start to pass clots or don't improve, call us.  DIET: You may return to your normal diet immediately. Because of the raw surface of your bladder, alcohol, spicy foods, acid type foods and drinks with caffeine may cause irritation or frequency and should be used in moderation. To keep your urine flowing freely and to avoid constipation, drink plenty of fluids during the day ( 8-10 glasses ). Tip: Avoid cranberry juice because it is very acidic.  ACTIVITY: Your physical activity doesn't need to be restricted. However, if you are very active, you may see some blood in your urine. We suggest that you reduce your activity under these circumstances until the bleeding has stopped.  BOWELS: It is important to  keep your bowels regular during the postoperative period. Straining with bowel movements can cause bleeding. A bowel movement every other day is reasonable. Use a mild laxative if needed, such as Milk of Magnesia 2-3 tablespoons, or 2 Dulcolax tablets. Call if you continue to have problems. If you have been taking narcotics for pain, before, during or after your surgery, you may be constipated. Take a laxative if necessary.   MEDICATION: You should resume your pre-surgery medications unless told not to. You may take oxybutynin or flomax if prescribed for bladder spasms or discomfort from the stent Take pain medication as directed for pain refractory to conservative management  PROBLEMS YOU SHOULD REPORT TO [place]:  Fevers over 100.5 Fahrenheit.  Heavy bleeding, or clots ( See above notes about blood in urine ).  Inability to urinate.  Drug reactions ( hives, rash, nausea, vomiting, diarrhea ).  Severe burning or pain with urination that is not improving.    Next dose Motrin.Aleve after 9 pm   Post Anesthesia Home Care Instructions  Activity: Get plenty of rest for the remainder of the day. A responsible individual must stay with you for 24 hours following the procedure.  For the next 24 hours, DO NOT: -Drive a car -Paediatric nurse -Drink alcoholic beverages -Take any medication unless instructed by your physician -Make any legal decisions or sign important papers.  Meals: Start with liquid foods such as gelatin or soup. Progress to regular foods as tolerated. Avoid greasy, spicy, heavy  foods. If nausea and/or vomiting occur, drink only clear liquids until the nausea and/or vomiting subsides. Call your physician if vomiting continues.  Special Instructions/Symptoms: Your throat may feel dry or sore from the anesthesia or the breathing tube placed in your throat during surgery. If this causes discomfort, gargle with warm salt water. The discomfort should disappear within 24  hours.  If you had a scopolamine patch placed behind your ear for the management of post- operative nausea and/or vomiting:  1. The medication in the patch is effective for 72 hours, after which it should be removed.  Wrap patch in a tissue and discard in the trash. Wash hands thoroughly with soap and water. 2. You may remove the patch earlier than 72 hours if you experience unpleasant side effects which may include dry mouth, dizziness or visual disturbances. 3. Avoid touching the patch. Wash your hands with soap and water after contact with the patch.

## 2018-08-18 NOTE — H&P (Signed)
H&P  Chief Complaint: Right ureteral calculus  History of Present Illness: 53 year old female with a right ureteral calculus failed trial passage.  She is having persistent pain.  She presents for ureteroscopy  Past Medical History:  Diagnosis Date  . Frequency of urination   . GERD (gastroesophageal reflux disease)   . Hypothyroidism    CURRENTLY NO MEDS  . Right ureteral stone   . Urgency of urination    Past Surgical History:  Procedure Laterality Date  . HOLMIUM LASER APPLICATION Right 6/38/7564   Procedure: HOLMIUM LASER APPLICATION;  Surgeon: Franchot Gallo, MD;  Location: [facility];  Service: Urology;  Laterality: Right;  . LAPAROSCOPIC CHOLECYSTECTOMY  05-06-2000  . URETEROSCOPY Right 03/15/2013   Procedure: URETEROSCOPY;  Surgeon: Franchot Gallo, MD;  Location: [facility];  Service: Urology;  Laterality: Right;    Home Medications:  Medications Prior to Admission  Medication Sig Dispense Refill Last Dose  . levothyroxine (SYNTHROID, LEVOTHROID) 125 MCG tablet Take 125 mcg by mouth daily before breakfast.    08/17/2018 at Unknown time  . RABEprazole (ACIPHEX) 20 MG tablet Take 20 mg by mouth 2 (two) times daily as needed (for acid reflux).    Past Month at Unknown time  . ursodiol (ACTIGALL) 500 MG tablet Take 500 mg by mouth 3 (three) times daily.      Allergies:  Allergies  Allergen Reactions  . Shrimp [Shellfish Allergy] Hives    Family History  Problem Relation Age of Onset  . Breast cancer Mother   . Diabetes Father   . Heart failure Father    Social History:  reports that she has never smoked. She has never used smokeless tobacco. She reports that she does not drink alcohol or use drugs.  ROS: A complete review of systems was performed.  All systems are negative except for pertinent findings as noted. ROS   Physical Exam:  Vital signs in last 24 hours: Temp:  [98 F (36.7 C)] 98 F (36.7 C) (07/31 1201) Pulse  Rate:  [77] 77 (07/31 1201) Resp:  [16] 16 (07/31 1201) BP: (149)/(100) 149/100 (07/31 1201) SpO2:  [99 %] 99 % (07/31 1201) Weight:  [82.4 kg] 82.4 kg (07/31 1201) General:  Alert and oriented, No acute distress HEENT: Normocephalic, atraumatic Neck: No JVD or lymphadenopathy Cardiovascular: Regular rate and rhythm Lungs: Regular rate and effort Abdomen: Soft, nontender, nondistended, no abdominal masses Back: No CVA tenderness Extremities: No edema Neurologic: Grossly intact  Laboratory Data:  Results for orders placed or performed during the hospital encounter of 08/18/18 (from the past 24 hour(s))  Pregnancy, urine POC     Status: None   Collection Time: 08/18/18 12:36 PM  Result Value Ref Range   Preg Test, Ur NEGATIVE NEGATIVE   Recent Results (from the past 240 hour(s))  SARS Coronavirus 2 [facility] order, Performed in [facility] hospital lab) Nasopharyngeal Nasopharyngeal Swab     Status: None   Collection Time: 08/18/18 10:03 AM   Specimen: Nasopharyngeal Swab  Result Value Ref Range Status   SARS Coronavirus 2 NEGATIVE NEGATIVE Final    Comment: (NOTE) If result is NEGATIVE SARS-CoV-2 target nucleic acids are NOT DETECTED. The SARS-CoV-2 RNA is generally detectable in upper and lower  respiratory specimens during the acute phase of infection. The lowest  concentration of SARS-CoV-2 viral copies this assay can detect is 250  copies / mL. A negative result does not preclude SARS-CoV-2 infection  and should not be used as the  sole basis for treatment or other  patient management decisions.  A negative result may occur with  improper specimen collection / handling, submission of specimen other  than nasopharyngeal swab, presence of viral mutation(s) within the  areas targeted by this assay, and inadequate number of viral copies  (<250 copies / mL). A negative result must be combined with clinical  observations, patient history, and epidemiological information. If  result is POSITIVE SARS-CoV-2 target nucleic acids are DETECTED. The SARS-CoV-2 RNA is generally detectable in upper and lower  respiratory specimens dur ing the acute phase of infection.  Positive  results are indicative of active infection with SARS-CoV-2.  Clinical  correlation with patient history and other diagnostic information is  necessary to determine patient infection status.  Positive results do  not rule out bacterial infection or co-infection with other viruses. If result is PRESUMPTIVE POSTIVE SARS-CoV-2 nucleic acids MAY BE PRESENT.   A presumptive positive result was obtained on the submitted specimen  and confirmed on repeat testing.  While 2019 novel coronavirus  (SARS-CoV-2) nucleic acids may be present in the submitted sample  additional confirmatory testing may be necessary for epidemiological  and / or clinical management purposes  to differentiate between  SARS-CoV-2 and other Sarbecovirus currently known to infect humans.  If clinically indicated additional testing with an alternate test  methodology [phone]) is advised. The SARS-CoV-2 RNA is generally  detectable in upper and lower respiratory sp ecimens during the acute  phase of infection. The expected result is Negative. Fact Sheet for Patients:  StrictlyIdeas.no Fact Sheet for Healthcare Providers: BankingDealers.co.za This test is not yet approved or cleared by the [place] FDA and has been authorized for detection and/or diagnosis of SARS-CoV-2 by FDA under an Emergency Use Authorization (EUA).  This EUA will remain in effect (meaning this test can be used) for the duration of the COVID-19 declaration under Section 564(b)(1) of the Act, 21 U.S.C. section 360bbb-3(b)(1), unless the authorization is terminated or revoked sooner. Performed at [facility], [place] [address] [place], [place] [address]    Creatinine: No results for  input(s): CREATININE in the last 168 hours.  Impression/Assessment:  Right renal calculus Flank pain  Plan:  Proceed with right ureteroscopy with laser lithotripsy, ureteral stent placement.  She understands potential risk including but not limited to bleeding, infection, injury to surrounding structures, need for additional procedures.  Marton Redwood, III 08/18/2018, 2:53 PM

## 2018-08-18 NOTE — Anesthesia Procedure Notes (Signed)
Procedure Name: LMA Insertion Date/Time: 08/18/2018 3:08 PM Performed by: Wanita Chamberlain, CRNA Pre-anesthesia Checklist: Patient identified, Timeout performed, Emergency Drugs available and Suction available Patient Re-evaluated:Patient Re-evaluated prior to induction Oxygen Delivery Method: Circle system utilized Preoxygenation: Pre-oxygenation with 100% oxygen Induction Type: IV induction Ventilation: Mask ventilation without difficulty LMA: LMA inserted LMA Size: 4.0 Laser Tube: Cuffed inflated with minimal occlusive pressure - saline Number of attempts: 1 Placement Confirmation: breath sounds checked- equal and bilateral,  CO2 detector and positive ETCO2 Tube secured with: Tape Dental Injury: Teeth and Oropharynx as per pre-operative assessment

## 2018-08-18 NOTE — Anesthesia Preprocedure Evaluation (Signed)
Anesthesia Evaluation  Patient identified by MRN, date of birth, ID band Patient awake    Reviewed: Allergy & Precautions, H&P , NPO status , Patient's Chart, lab work & pertinent test results  Airway Mallampati: II   Neck ROM: full    Dental   Pulmonary neg pulmonary ROS,    breath sounds clear to auscultation       Cardiovascular negative cardio ROS   Rhythm:regular Rate:Normal     Neuro/Psych    GI/Hepatic GERD  ,  Endo/Other  Hypothyroidism   Renal/GU stones     Musculoskeletal   Abdominal   Peds  Hematology   Anesthesia Other Findings   Reproductive/Obstetrics                             Anesthesia Physical Anesthesia Plan  ASA: II  Anesthesia Plan: General   Post-op Pain Management:    Induction: Intravenous  PONV Risk Score and Plan: 3 and Ondansetron, Dexamethasone, Midazolam and Treatment may vary due to age or medical condition  Airway Management Planned: LMA  Additional Equipment:   Intra-op Plan:   Post-operative Plan:   Informed Consent: I have reviewed the patients History and Physical, chart, labs and discussed the procedure including the risks, benefits and alternatives for the proposed anesthesia with the patient or authorized representative who has indicated his/her understanding and acceptance.       Plan Discussed with: CRNA, Anesthesiologist and Surgeon  Anesthesia Plan Comments:         Anesthesia Quick Evaluation

## 2018-08-18 NOTE — Transfer of Care (Signed)
Immediate Anesthesia Transfer of Care Note  Patient: Angie Manning  Procedure(s) Performed: CYSTOSCOPY/URETEROSCOPY/STENT PLACEMENT basket stone extraction (Right Renal)  Patient Location: PACU  Anesthesia Type:General  Level of Consciousness: awake, alert , oriented and patient cooperative  Airway & Oxygen Therapy: Patient Spontanous Breathing and Patient connected to nasal cannula oxygen  Post-op Assessment: Report given to RN and Post -op Vital signs reviewed and stable  Post vital signs: Reviewed and stable  Last Vitals:  Vitals Value Taken Time  BP    Temp    Pulse    Resp    SpO2      Last Pain:  Vitals:   08/18/18 1221  TempSrc:   PainSc: 0-No pain      Patients Stated Pain Goal: 3 (98/02/21 7981)  Complications: No apparent anesthesia complications

## 2018-08-18 NOTE — Anesthesia Postprocedure Evaluation (Signed)
Anesthesia Post Note  Patient: Angie Manning  Procedure(s) Performed: CYSTOSCOPY/URETEROSCOPY/STENT PLACEMENT basket stone extraction (Right Renal)     Patient location during evaluation: PACU Anesthesia Type: General Level of consciousness: awake and alert and oriented Pain management: pain level controlled Vital Signs Assessment: post-procedure vital signs reviewed and stable Respiratory status: spontaneous breathing, nonlabored ventilation and respiratory function stable Cardiovascular status: blood pressure returned to baseline and stable Postop Assessment: no apparent nausea or vomiting Anesthetic complications: no    Last Vitals:  Vitals:   08/18/18 1201 08/18/18 1538  BP: (!) 149/100   Pulse: 77   Resp: 16 14  Temp: 36.7 C   SpO2: 99% 98%    Last Pain:  Vitals:   08/18/18 1600  TempSrc:   PainSc: 3                  Andri Prestia A.

## 2018-08-18 NOTE — Op Note (Signed)
Operative Note  Preoperative diagnosis:  1.  Right ureteral calculus  Postoperative diagnosis: 1.  Right ureteral calculus  Procedure(s): 1.  Cystoscopy, right ureteroscopy with stone extraction, right ureteral stent placement  Surgeon: Link Snuffer, MD  Assistants: None  Anesthesia: General  Complications: None immediate  EBL: Minimal  Specimens: 1.  Renal calculus  Drains/Catheters: 1.  6 x 24 double-J ureteral stent  Intraoperative findings: 4 mm right ureteral calculus basket extracted with minimal resistance.  Periureteral inflammation at the ureteral orifice.  Bladder mucosa was normal.  Urethra was normal.  Indication: 53 year old female with a right ureteral calculus and persistent pain presents for the previously mentioned operation.  Description of procedure:  The patient was identified and consent was obtained.  The patient was taken to the operating room and placed in the supine position.  The patient was placed under general anesthesia.  Perioperative antibiotics were administered.  The patient was placed in dorsal lithotomy.  Patient was prepped and draped in a standard sterile fashion and a timeout was performed.  A 21 French rigid cystoscope was advanced into the urethra and into the bladder.  Complete cystoscopy was performed with no abnormal findings.  The right ureter was cannulated with a sensor wire which was advanced up to the kidney under fluoroscopic guidance.  A semirigid ureteroscope was passed into the distal ureter and there was a stone at the ureterovesicular junction.  This was basket extracted.  I collected for specimen.  I then reinspected the ureter and advanced the scope up to the renal pelvis.  No other ureteral calculi were seen.  I withdrew the scope and backloaded the wire onto a rigid cystoscope which was advanced into the bladder followed by routine placement of a 6 x 24 double-J ureteral stent in a standard fashion followed by removal of the  wire.  Fluoroscopy confirmed proximal placement and direct visualization confirmed a good coil within the bladder.  I drained the bladder and withdrew the scope.  The patient tolerated the procedure well was stable postoperatively.  Plan: Return in 1 week for stent removal

## 2018-08-19 LAB — SARS CORONAVIRUS 2 BY RT PCR (HOSPITAL ORDER, PERFORMED IN ~~LOC~~ HOSPITAL LAB): SARS Coronavirus 2: NEGATIVE

## 2018-08-21 ENCOUNTER — Encounter (HOSPITAL_BASED_OUTPATIENT_CLINIC_OR_DEPARTMENT_OTHER): Payer: Self-pay | Admitting: Urology

## 2018-08-24 DIAGNOSIS — N201 Calculus of ureter: Secondary | ICD-10-CM | POA: Diagnosis not present

## 2018-08-24 NOTE — Addendum Note (Signed)
Addendum  created 08/24/18 1738 by Josephine Igo, MD   Intraprocedure Attestations deleted, Intraprocedure Staff edited

## 2018-09-13 DIAGNOSIS — Z01419 Encounter for gynecological examination (general) (routine) without abnormal findings: Secondary | ICD-10-CM | POA: Diagnosis not present

## 2018-09-13 DIAGNOSIS — Z1231 Encounter for screening mammogram for malignant neoplasm of breast: Secondary | ICD-10-CM | POA: Diagnosis not present

## 2018-09-13 DIAGNOSIS — Z6833 Body mass index (BMI) 33.0-33.9, adult: Secondary | ICD-10-CM | POA: Diagnosis not present

## 2018-09-27 DIAGNOSIS — Z13820 Encounter for screening for osteoporosis: Secondary | ICD-10-CM | POA: Diagnosis not present

## 2018-09-27 DIAGNOSIS — E038 Other specified hypothyroidism: Secondary | ICD-10-CM | POA: Diagnosis not present

## 2018-09-27 DIAGNOSIS — E063 Autoimmune thyroiditis: Secondary | ICD-10-CM | POA: Diagnosis not present

## 2018-09-27 DIAGNOSIS — N941 Unspecified dyspareunia: Secondary | ICD-10-CM | POA: Diagnosis not present

## 2018-09-28 DIAGNOSIS — N201 Calculus of ureter: Secondary | ICD-10-CM | POA: Diagnosis not present

## 2018-09-28 DIAGNOSIS — R1032 Left lower quadrant pain: Secondary | ICD-10-CM | POA: Diagnosis not present

## 2018-10-03 DIAGNOSIS — Z23 Encounter for immunization: Secondary | ICD-10-CM | POA: Diagnosis not present

## 2018-10-03 DIAGNOSIS — L299 Pruritus, unspecified: Secondary | ICD-10-CM | POA: Diagnosis not present

## 2018-10-03 DIAGNOSIS — Z13220 Encounter for screening for lipoid disorders: Secondary | ICD-10-CM | POA: Diagnosis not present

## 2018-10-03 DIAGNOSIS — Z0000 Encounter for general adult medical examination without abnormal findings: Secondary | ICD-10-CM | POA: Diagnosis not present

## 2018-10-03 DIAGNOSIS — Z131 Encounter for screening for diabetes mellitus: Secondary | ICD-10-CM | POA: Diagnosis not present

## 2018-10-05 DIAGNOSIS — E063 Autoimmune thyroiditis: Secondary | ICD-10-CM | POA: Diagnosis not present

## 2018-10-05 DIAGNOSIS — E038 Other specified hypothyroidism: Secondary | ICD-10-CM | POA: Diagnosis not present

## 2018-11-07 DIAGNOSIS — R748 Abnormal levels of other serum enzymes: Secondary | ICD-10-CM | POA: Diagnosis not present

## 2018-11-24 ENCOUNTER — Other Ambulatory Visit: Payer: Self-pay

## 2018-11-24 DIAGNOSIS — Z20822 Contact with and (suspected) exposure to covid-19: Secondary | ICD-10-CM

## 2018-11-25 LAB — NOVEL CORONAVIRUS, NAA: SARS-CoV-2, NAA: NOT DETECTED

## 2018-12-20 DIAGNOSIS — Z1159 Encounter for screening for other viral diseases: Secondary | ICD-10-CM | POA: Diagnosis not present

## 2018-12-25 DIAGNOSIS — K6289 Other specified diseases of anus and rectum: Secondary | ICD-10-CM | POA: Diagnosis not present

## 2018-12-25 DIAGNOSIS — K5730 Diverticulosis of large intestine without perforation or abscess without bleeding: Secondary | ICD-10-CM | POA: Diagnosis not present

## 2018-12-25 DIAGNOSIS — D124 Benign neoplasm of descending colon: Secondary | ICD-10-CM | POA: Diagnosis not present

## 2018-12-25 DIAGNOSIS — D122 Benign neoplasm of ascending colon: Secondary | ICD-10-CM | POA: Diagnosis not present

## 2018-12-25 DIAGNOSIS — Z1211 Encounter for screening for malignant neoplasm of colon: Secondary | ICD-10-CM | POA: Diagnosis not present

## 2019-02-08 DIAGNOSIS — Z23 Encounter for immunization: Secondary | ICD-10-CM | POA: Diagnosis not present

## 2019-04-25 DIAGNOSIS — E063 Autoimmune thyroiditis: Secondary | ICD-10-CM | POA: Diagnosis not present

## 2019-04-25 DIAGNOSIS — E038 Other specified hypothyroidism: Secondary | ICD-10-CM | POA: Diagnosis not present

## 2019-05-02 DIAGNOSIS — E063 Autoimmune thyroiditis: Secondary | ICD-10-CM | POA: Diagnosis not present

## 2019-05-02 DIAGNOSIS — E038 Other specified hypothyroidism: Secondary | ICD-10-CM | POA: Diagnosis not present

## 2019-06-07 DIAGNOSIS — R1084 Generalized abdominal pain: Secondary | ICD-10-CM | POA: Diagnosis not present

## 2019-06-07 DIAGNOSIS — R8271 Bacteriuria: Secondary | ICD-10-CM | POA: Diagnosis not present

## 2019-06-07 DIAGNOSIS — N200 Calculus of kidney: Secondary | ICD-10-CM | POA: Diagnosis not present

## 2019-06-11 DIAGNOSIS — R10811 Right upper quadrant abdominal tenderness: Secondary | ICD-10-CM | POA: Diagnosis not present

## 2019-06-12 ENCOUNTER — Other Ambulatory Visit: Payer: Self-pay | Admitting: Family Medicine

## 2019-06-12 DIAGNOSIS — R10811 Right upper quadrant abdominal tenderness: Secondary | ICD-10-CM

## 2019-06-13 ENCOUNTER — Ambulatory Visit
Admission: RE | Admit: 2019-06-13 | Discharge: 2019-06-13 | Disposition: A | Discharge status: Home / Self Care | Payer: BC Managed Care – PPO | Source: Ambulatory Visit | Attending: Family Medicine | Admitting: Family Medicine

## 2019-06-13 DIAGNOSIS — K7689 Other specified diseases of liver: Secondary | ICD-10-CM | POA: Diagnosis not present

## 2019-06-13 DIAGNOSIS — R10811 Right upper quadrant abdominal tenderness: Secondary | ICD-10-CM

## 2019-07-17 DIAGNOSIS — J320 Chronic maxillary sinusitis: Secondary | ICD-10-CM | POA: Diagnosis not present

## 2019-07-31 ENCOUNTER — Other Ambulatory Visit: Payer: Self-pay | Admitting: Gastroenterology

## 2019-07-31 DIAGNOSIS — R1011 Right upper quadrant pain: Secondary | ICD-10-CM

## 2019-08-14 ENCOUNTER — Ambulatory Visit
Admission: RE | Admit: 2019-08-14 | Discharge: 2019-08-14 | Disposition: A | Discharge status: Home / Self Care | Payer: BC Managed Care – PPO | Source: Ambulatory Visit | Attending: Gastroenterology | Admitting: Gastroenterology

## 2019-08-14 DIAGNOSIS — N281 Cyst of kidney, acquired: Secondary | ICD-10-CM | POA: Diagnosis not present

## 2019-08-14 DIAGNOSIS — N200 Calculus of kidney: Secondary | ICD-10-CM | POA: Diagnosis not present

## 2019-08-14 DIAGNOSIS — R1011 Right upper quadrant pain: Secondary | ICD-10-CM

## 2019-08-14 DIAGNOSIS — M47814 Spondylosis without myelopathy or radiculopathy, thoracic region: Secondary | ICD-10-CM | POA: Diagnosis not present

## 2019-08-14 DIAGNOSIS — Z9049 Acquired absence of other specified parts of digestive tract: Secondary | ICD-10-CM | POA: Diagnosis not present

## 2019-08-14 MED ORDER — IOPAMIDOL (ISOVUE-300) INJECTION 61%
100.0000 mL | Freq: Once | INTRAVENOUS | Status: AC | PRN
  Administered 2019-08-14: 100 mL via INTRAVENOUS

## 2019-08-30 ENCOUNTER — Encounter: Payer: Self-pay | Admitting: Genetic Counselor

## 2019-10-10 DIAGNOSIS — Z0000 Encounter for general adult medical examination without abnormal findings: Secondary | ICD-10-CM | POA: Diagnosis not present

## 2019-10-10 DIAGNOSIS — E782 Mixed hyperlipidemia: Secondary | ICD-10-CM | POA: Diagnosis not present

## 2019-11-06 DIAGNOSIS — E038 Other specified hypothyroidism: Secondary | ICD-10-CM | POA: Diagnosis not present

## 2019-11-06 DIAGNOSIS — E063 Autoimmune thyroiditis: Secondary | ICD-10-CM | POA: Diagnosis not present

## 2019-11-09 DIAGNOSIS — E063 Autoimmune thyroiditis: Secondary | ICD-10-CM | POA: Diagnosis not present

## 2019-11-09 DIAGNOSIS — E038 Other specified hypothyroidism: Secondary | ICD-10-CM | POA: Diagnosis not present

## 2020-01-01 DIAGNOSIS — N951 Menopausal and female climacteric states: Secondary | ICD-10-CM | POA: Diagnosis not present

## 2020-01-01 DIAGNOSIS — Z6832 Body mass index (BMI) 32.0-32.9, adult: Secondary | ICD-10-CM | POA: Diagnosis not present

## 2020-01-01 DIAGNOSIS — Z01419 Encounter for gynecological examination (general) (routine) without abnormal findings: Secondary | ICD-10-CM | POA: Diagnosis not present

## 2020-01-01 DIAGNOSIS — Z1231 Encounter for screening mammogram for malignant neoplasm of breast: Secondary | ICD-10-CM | POA: Diagnosis not present

## 2020-04-28 ENCOUNTER — Telehealth: Payer: Self-pay | Admitting: Cardiovascular Disease

## 2020-04-28 NOTE — Telephone Encounter (Signed)
Left a message for the pt to call back.  

## 2020-04-28 NOTE — Telephone Encounter (Signed)
Pt c/o of Chest Pain: STAT if CP now or developed within 24 hours  1. Are you having CP right now? no  2. Are you experiencing any other symptoms (ex. SOB, nausea, vomiting, sweating)? Dizziness, nausea   3. How long have you been experiencing CP? Started the weekend  4. Is your CP continuous or coming and going? Coming and going  5. Have you taken Nitroglycerin? No  Patient stated that over the weekend she has had pain in her left arm and well as discomfort in her chest. ?

## 2020-04-30 ENCOUNTER — Encounter: Payer: Self-pay | Admitting: Cardiovascular Disease

## 2020-04-30 ENCOUNTER — Ambulatory Visit: Payer: BC Managed Care – PPO | Admitting: Cardiovascular Disease

## 2020-04-30 VITALS — BP 148/106 | HR 90 | Ht 64.0 in | Wt 191.6 lb

## 2020-04-30 DIAGNOSIS — I701 Atherosclerosis of renal artery: Secondary | ICD-10-CM | POA: Diagnosis not present

## 2020-04-30 DIAGNOSIS — R079 Chest pain, unspecified: Secondary | ICD-10-CM

## 2020-04-30 MED ORDER — LOSARTAN POTASSIUM 50 MG PO TABS: 50.0000 mg | ORAL_TABLET | Freq: Every day | ORAL | 0 refills | Status: DC

## 2020-04-30 NOTE — Telephone Encounter (Signed)
Patient has an appointment with Dr. Sallyanne Kuster today, 4/13.

## 2020-04-30 NOTE — Progress Notes (Signed)
Cardiology Office Note:    Date:  05/02/2020   ID:  Angie Manning, DOB [birth year], MRN [id number]  PCP:  Fanny Bien, [place]  Cardiologist:  Sanda Klein, MD NEW Advanced Practice Provider:  No care team member to display Electrophysiologist:  None       Referring MD: Fanny Bien, MD   No chief complaint on file. Angie Manning is a 55 y.o. female who is being seen today for the evaluation of chest/left arm pain at the request of Fanny Bien, MD.   History of Present Illness:    Angie Manning is a 55 y.o. female with a hx of GERD and nephrolithiasis that has recently developed for some pain in her left arm and marked elevation in blood pressure.  Bonna had problems with hypertension, frequent PVCs PACs and even atrial fibrillation during her pregnancy in 2005.  She was prescribed medications and bedrest and all of these issues resolved after her delivery.  She has not had any cardiac problems since.  In 2017 she had an episode of chest tightness that is associated with dizziness nausea and vomiting and palpitations that occurred following a kayaking trip on a very hot day.  A treadmill stress test and an echocardiogram were recommended at that time, but since her symptoms resolved and did not recur, she never followed through on those tests.  Over the last few weeks she has had increasing frequencies of palpitations as well as a "shooting pain" that comes and goes in her left arm.  It starts in the left axilla and radiates all the way to her hand and the fourth fingers other than her thumb are painful and numb.  It occurs at rest and has no association with physical activity or meals.  When the discomfort is more severe it also associates some chest pain and nausea.  Last September her blood pressure was documented at 120/82.  Recently her diastolic blood pressure has often been > 100 mm Hg.  In the office  today, even after relaxing for about 15 minutes her blood pressure remained 142/107, equal in the right and left upper extremities.  She reports a recent TSH in normal range about 3 months ago, although I do not have that result.  No history of kidney function abnormalities, although she does have a longstanding history of kidney stones and has twice undergone surgery..  She underwent a CT scan in June 2021 for kidney stones.  I reviewed that study.  All the contrast is in the urinary collecting system and it is not an adequate test for renal artery stenosis, although there is no evidence of atherosclerotic calcification in the renal arteries, aorta or any of the other visualized vessels, including the bottom half of the cardiac silhouette.  No adenomas are seen in the adrenal glands on that study.  Past Medical History:  Diagnosis Date  . Frequency of urination   . GERD (gastroesophageal reflux disease)   . Hypothyroidism    CURRENTLY NO MEDS  . Right ureteral stone   . Urgency of urination     Past Surgical History:  Procedure Laterality Date  . CYSTOSCOPY/URETEROSCOPY/HOLMIUM LASER/STENT PLACEMENT Right 08/18/2018   Procedure: CYSTOSCOPY/URETEROSCOPY/STENT PLACEMENT basket stone extraction;  Surgeon: Lucas Mallow, MD;  Location: [facility];  Service: Urology;  Laterality: Right;  . HOLMIUM LASER APPLICATION Right 7/67/2094   Procedure: HOLMIUM LASER APPLICATION;  Surgeon: Franchot Gallo, MD;  Location: [place];  Service: Urology;  Laterality: Right;  . LAPAROSCOPIC CHOLECYSTECTOMY  05-06-2000  . URETEROSCOPY Right 03/15/2013   Procedure: URETEROSCOPY;  Surgeon: Franchot Gallo, MD;  Location: [facility];  Service: Urology;  Laterality: Right;    Current Medications: Current Meds  Medication Sig  . levothyroxine (SYNTHROID, LEVOTHROID) 125 MCG tablet Take 125 mcg by mouth daily before breakfast.   . losartan (COZAAR) 50 MG  tablet Take 1 tablet (50 mg total) by mouth daily.  . RABEprazole (ACIPHEX) 20 MG tablet Take 20 mg by mouth 2 (two) times daily as needed (for acid reflux).      Allergies:   Shrimp [shellfish allergy]   Social History   Socioeconomic History  . Marital status: Married    Spouse name: Not on file  . Number of children: 3  . Years of education: Not on file  . Highest education level: Not on file  Occupational History  . Not on file  Tobacco Use  . Smoking status: Never Smoker  . Smokeless tobacco: Never Used  Vaping Use  . Vaping Use: Never used  Substance and Sexual Activity  . Alcohol use: No  . Drug use: No  . Sexual activity: Yes  Other Topics Concern  . Not on file  Social History Narrative  . Not on file   Social Determinants of Health   Financial Resource Strain: Not on file  Food Insecurity: Not on file  Transportation Needs: Not on file  Physical Activity: Not on file  Stress: Not on file  Social Connections: Not on file     Family History: The patient's family history includes Breast cancer in her mother; Diabetes in her father; Heart failure in her father.  Her father had atrial fibrillation and an ablation procedure.  He passed away at age 53.  ROS:   Please see the history of present illness.    All other systems reviewed and are negative.  EKGs/Labs/Other Studies Reviewed:    The following studies were reviewed today: CT of the abdomen with contrast June 2021  EKG:  EKG is  ordered today.  The ekg ordered today demonstrates normal sinus rhythm, normal tracing  Recent Labs: 04/30/2020: BUN 14; Creatinine, Ser 0.87; Potassium 5.0; Sodium 143  07/31/2019 Creatinine 0.76, potassium 4.5, ALT 26 Recent Lipid Panel No results found for: CHOL, TRIG, HDL, CHOLHDL, VLDL, LDLCALC, LDLDIRECT 10/10/2019 Cholesterol 204, HDL 53, LDL 129, triglycerides 125  Risk Assessment/Calculations:       Physical Exam:    VS:  BP (!) 148/106 (BP Location: Right  Arm, Patient Position: Sitting, Cuff Size: Large)   Pulse 90   Ht 5\' 4"  (1.626 m)   Wt 191 lb 9.6 oz (86.9 kg)   SpO2 97%   BMI 32.89 kg/m     Wt Readings from Last 3 Encounters:  04/30/20 191 lb 9.6 oz (86.9 kg)  08/18/18 181 lb 9.6 oz (82.4 kg)  06/29/17 189 lb (85.7 kg)     GEN: Mildly obese, well nourished, well developed in no acute distress HEENT: Normal NECK: No JVD; No carotid bruits LYMPHATICS: No lymphadenopathy CARDIAC: RRR, no murmurs, rubs, gallops RESPIRATORY:  Clear to auscultation without rales, wheezing or rhonchi  ABDOMEN: Soft, non-tender, non-distended MUSCULOSKELETAL:  No edema; No deformity  SKIN: Warm and dry NEUROLOGIC:  Alert and oriented x 3 PSYCHIATRIC:  Normal affect   ASSESSMENT:    1. Chest pain, unspecified type   2. Renal artery stenosis ([place])  PLAN:    In order of problems listed above:  1. Chest/arm pain: This appears to be highly atypical for angina pectoris.  It sounds more neuropathic.  A treadmill stress test is reasonable, but this should wait until we  get her blood pressure under control.  We will also schedule for an echocardiogram. 2. HTN: This is remarkably severe and abrupt in onset and raises the possibility of secondary hypertension.  We will check a renal duplex ultrasound and check BMET and plasma renin-aldosterone before we initiate medical therapy.  Atherosclerotic renal artery stenosis appears unlikely, but she could have intimal hyperplasia.  We will start losartan 25 mg daily, increase this to 50 mg daily if her blood pressure still high in 2 or 3 days. 3. HLP: Her LDL cholesterol is borderline elevated, but if we do not identify coronary or other vascular abnormalities, this should be addressed with lifestyle changes rather than medications.  If signs of CAD or PAD are identified, she will require pharmacological therapy.   Shared Decision Making/Informed Consent  The risks [chest pain, shortness of breath, cardiac  arrhythmias, dizziness, blood pressure fluctuations, myocardial infarction, stroke/transient ischemic attack, and life-threatening complications (estimated to be 1 in 10,000)], benefits (risk stratification, diagnosing coronary artery disease, treatment guidance) and alternatives of an exercise tolerance test were discussed in detail with Angie Manning and she agrees to proceed.    Medication Adjustments/Labs and Tests Ordered: Current medicines are reviewed at length with the patient today.  Concerns regarding medicines are outlined above.  Orders Placed This Encounter  Procedures  . Aldosterone + renin activity w/ ratio  . Basic metabolic panel  . Exercise Tolerance Test  . EKG 12-Lead  . ECHOCARDIOGRAM COMPLETE  . VAS US RENAL ARTERY DUPLEX   Meds ordered this encounter  Medications  . losartan (COZAAR) 50 MG tablet    Sig: Take 1 tablet (50 mg total) by mouth daily.    Dispense:  90 tablet    Refill:  0    Patient Instructions  Medication Instructions:  START Losartan. For the first 3 days take a half tablet, if blood pressure is still over 140/90 increase to a whole tablet. After the first 3 days, take 50mg  (whole tablet) once daily.   *If you need a refill on your cardiac medications before your next appointment, please call your pharmacy*   Lab Work: BMP, Renin-aldosterone to be drawn today.   If you have labs (blood work) drawn today and your tests are completely normal, you will receive your results only by: Marland Kitchen MyChart Message (if you have MyChart) OR . A paper copy in the mail If you have any lab test that is abnormal or we need to change your treatment, we will call you to review the results.   Testing/Procedures: Your physician has requested that you have an echocardiogram. Echocardiography is a painless test that uses sound waves to create images of your heart. It provides your doctor with information about the size and shape of your heart and how well your  heart's chambers and valves are working. This procedure takes approximately one hour. There are no restrictions for this procedure.  Your physician has requested that you have a renal artery duplex. During this test, an ultrasound is used to evaluate blood flow to the kidneys. Allow one hour for this exam. Do not eat after midnight the day before and avoid carbonated beverages. Take your medications as you usually do.  Your physician has requested that you have  an exercise tolerance test. For further information please visit [email or web address]. Please also follow instruction sheet, as given. You will need a covid test for this test.      Follow-Up: At [facility], you and your health needs are our priority.  As part of our continuing mission to provide you with exceptional heart care, we have created designated Provider Care Teams.  These Care Teams include your primary Cardiologist (physician) and Advanced Practice Providers (APPs -  Physician Assistants and Nurse Practitioners) who all work together to provide you with the care you need, when you need it.  We recommend signing up for the patient portal called "MyChart".  Sign up information is provided on this After Visit Summary.  MyChart is used to connect with patients for Virtual Visits (Telemedicine).  Patients are able to view lab/test results, encounter notes, upcoming appointments, etc.  Non-urgent messages can be sent to your provider as well.   To learn more about what you can do with MyChart, go to [email or web address].    Your next appointment:   2 month(s)  The format for your next appointment:   In Person  Provider:   Sanda Klein, MD        Signed, Sanda Klein, MD  05/02/2020 11:25 AM    [place]

## 2020-04-30 NOTE — Patient Instructions (Signed)
Medication Instructions:  START Losartan. For the first 3 days take a half tablet, if blood pressure is still over 140/90 increase to a whole tablet. After the first 3 days, take 50mg  (whole tablet) once daily.   *If you need a refill on your cardiac medications before your next appointment, please call your pharmacy*   Lab Work: BMP, Renin-aldosterone to be drawn today.   If you have labs (blood work) drawn today and your tests are completely normal, you will receive your results only by: Marland Kitchen MyChart Message (if you have MyChart) OR . A paper copy in the mail If you have any lab test that is abnormal or we need to change your treatment, we will call you to review the results.   Testing/Procedures: Your physician has requested that you have an echocardiogram. Echocardiography is a painless test that uses sound waves to create images of your heart. It provides your doctor with information about the size and shape of your heart and how well your heart's chambers and valves are working. This procedure takes approximately one hour. There are no restrictions for this procedure.  Your physician has requested that you have a renal artery duplex. During this test, an ultrasound is used to evaluate blood flow to the kidneys. Allow one hour for this exam. Do not eat after midnight the day before and avoid carbonated beverages. Take your medications as you usually do.  Your physician has requested that you have an exercise tolerance test. For further information please visit [email or web address]. Please also follow instruction sheet, as given. You will need a covid test for this test.      Follow-Up: At [facility], you and your health needs are our priority.  As part of our continuing mission to provide you with exceptional heart care, we have created designated Provider Care Teams.  These Care Teams include your primary Cardiologist (physician) and Advanced Practice Providers (APPs -  Physician  Assistants and Nurse Practitioners) who all work together to provide you with the care you need, when you need it.  We recommend signing up for the patient portal called "MyChart".  Sign up information is provided on this After Visit Summary.  MyChart is used to connect with patients for Virtual Visits (Telemedicine).  Patients are able to view lab/test results, encounter notes, upcoming appointments, etc.  Non-urgent messages can be sent to your provider as well.   To learn more about what you can do with MyChart, go to [email or web address].    Your next appointment:   2 month(s)  The format for your next appointment:   In Person  Provider:   Sanda Klein, MD

## 2020-05-05 ENCOUNTER — Other Ambulatory Visit: Payer: Self-pay | Admitting: *Deleted

## 2020-05-05 MED ORDER — LOSARTAN POTASSIUM 25 MG PO TABS: 25.0000 mg | ORAL_TABLET | Freq: Every day | ORAL | 0 refills | Status: DC

## 2020-05-12 LAB — BASIC METABOLIC PANEL
BUN/Creatinine Ratio: 16 (ref 9–23)
BUN: 14 mg/dL (ref 6–24)
CO2: 20 mmol/L (ref 20–29)
Calcium: 9.8 mg/dL (ref 8.7–10.2)
Chloride: 104 mmol/L (ref 96–106)
Creatinine, Ser: 0.87 mg/dL (ref 0.57–1.00)
Glucose: 86 mg/dL (ref 65–99)
Potassium: 5 mmol/L (ref 3.5–5.2)
Sodium: 143 mmol/L (ref 134–144)
eGFR: 80 mL/min/{1.73_m2} (ref >59)

## 2020-05-12 LAB — ALDOSTERONE + RENIN ACTIVITY W/ RATIO
ALDOS/RENIN RATIO: 12.9 (ref 0.0–30.0)
ALDOSTERONE: 5 ng/dL (ref 0.0–30.0)
Renin: 0.387 ng/mL/hr (ref 0.167–5.380)

## 2020-05-13 ENCOUNTER — Ambulatory Visit (HOSPITAL_COMMUNITY)
Admission: RE | Admit: 2020-05-13 | Discharge: 2020-05-13 | Disposition: A | Discharge status: Home / Self Care | Payer: BC Managed Care – PPO | Source: Ambulatory Visit | Attending: Cardiovascular Disease | Admitting: Cardiovascular Disease

## 2020-05-13 ENCOUNTER — Other Ambulatory Visit: Payer: Self-pay

## 2020-05-13 DIAGNOSIS — I701 Atherosclerosis of renal artery: Secondary | ICD-10-CM

## 2020-05-14 DIAGNOSIS — H43811 Vitreous degeneration, right eye: Secondary | ICD-10-CM | POA: Diagnosis not present

## 2020-05-15 ENCOUNTER — Encounter: Payer: Self-pay | Admitting: *Deleted

## 2020-05-16 ENCOUNTER — Telehealth (HOSPITAL_COMMUNITY): Payer: Self-pay | Admitting: *Deleted

## 2020-05-16 ENCOUNTER — Other Ambulatory Visit (HOSPITAL_COMMUNITY)
Admission: RE | Admit: 2020-05-16 | Discharge: 2020-05-16 | Disposition: A | Discharge status: Home / Self Care | Payer: BC Managed Care – PPO | Source: Ambulatory Visit | Attending: Cardiovascular Disease | Admitting: Cardiovascular Disease

## 2020-05-16 DIAGNOSIS — Z20822 Contact with and (suspected) exposure to covid-19: Secondary | ICD-10-CM | POA: Diagnosis not present

## 2020-05-16 DIAGNOSIS — Z01812 Encounter for preprocedural laboratory examination: Secondary | ICD-10-CM | POA: Diagnosis not present

## 2020-05-16 NOTE — Telephone Encounter (Signed)
Close encounter 

## 2020-05-17 LAB — SARS CORONAVIRUS 2 (TAT 6-24 HRS): SARS Coronavirus 2: NEGATIVE

## 2020-05-18 DIAGNOSIS — Z8616 Personal history of COVID-19: Secondary | ICD-10-CM

## 2020-05-18 HISTORY — DX: Personal history of COVID-19: Z86.16

## 2020-05-20 ENCOUNTER — Ambulatory Visit (HOSPITAL_COMMUNITY)
Admission: RE | Admit: 2020-05-20 | Discharge: 2020-05-20 | Disposition: A | Discharge status: Home / Self Care | Payer: BC Managed Care – PPO | Source: Ambulatory Visit | Attending: Cardiovascular Disease | Admitting: Cardiovascular Disease

## 2020-05-20 ENCOUNTER — Other Ambulatory Visit: Payer: Self-pay

## 2020-05-20 DIAGNOSIS — R079 Chest pain, unspecified: Secondary | ICD-10-CM | POA: Diagnosis not present

## 2020-05-20 LAB — EXERCISE TOLERANCE TEST
Estimated workload: 10.5 METS
Exercise duration (min): 9 min
Exercise duration (sec): 15 s
MPHR: 168 {beats}/min
Peak HR: 169 {beats}/min
Percent HR: 100 %
Rest HR: 99 {beats}/min

## 2020-06-06 ENCOUNTER — Ambulatory Visit (HOSPITAL_COMMUNITY): Payer: BC Managed Care – PPO | Attending: Internal Medicine

## 2020-06-06 ENCOUNTER — Other Ambulatory Visit: Payer: Self-pay

## 2020-06-06 DIAGNOSIS — R079 Chest pain, unspecified: Secondary | ICD-10-CM

## 2020-06-06 LAB — ECHOCARDIOGRAM COMPLETE
Area-P 1/2: 4.13 cm2
S' Lateral: 2.8 cm

## 2020-06-08 DIAGNOSIS — R0981 Nasal congestion: Secondary | ICD-10-CM | POA: Diagnosis not present

## 2020-06-08 DIAGNOSIS — R059 Cough, unspecified: Secondary | ICD-10-CM | POA: Diagnosis not present

## 2020-06-08 DIAGNOSIS — U071 COVID-19: Secondary | ICD-10-CM | POA: Diagnosis not present

## 2020-06-08 DIAGNOSIS — R509 Fever, unspecified: Secondary | ICD-10-CM | POA: Diagnosis not present

## 2020-07-01 DIAGNOSIS — E063 Autoimmune thyroiditis: Secondary | ICD-10-CM | POA: Diagnosis not present

## 2020-07-01 DIAGNOSIS — E038 Other specified hypothyroidism: Secondary | ICD-10-CM | POA: Diagnosis not present

## 2020-07-04 DIAGNOSIS — E063 Autoimmune thyroiditis: Secondary | ICD-10-CM | POA: Diagnosis not present

## 2020-07-04 DIAGNOSIS — E038 Other specified hypothyroidism: Secondary | ICD-10-CM | POA: Diagnosis not present

## 2020-07-17 ENCOUNTER — Ambulatory Visit: Payer: BC Managed Care – PPO | Admitting: Cardiovascular Disease

## 2020-10-14 DIAGNOSIS — E669 Obesity, unspecified: Secondary | ICD-10-CM | POA: Diagnosis not present

## 2020-10-14 DIAGNOSIS — Z0000 Encounter for general adult medical examination without abnormal findings: Secondary | ICD-10-CM | POA: Diagnosis not present

## 2020-10-14 DIAGNOSIS — Z13220 Encounter for screening for lipoid disorders: Secondary | ICD-10-CM | POA: Diagnosis not present

## 2020-10-14 DIAGNOSIS — Z23 Encounter for immunization: Secondary | ICD-10-CM | POA: Diagnosis not present

## 2020-11-28 ENCOUNTER — Other Ambulatory Visit: Payer: Self-pay

## 2020-11-28 ENCOUNTER — Encounter: Payer: Self-pay | Admitting: Cardiovascular Disease

## 2020-11-28 ENCOUNTER — Ambulatory Visit: Payer: BC Managed Care – PPO | Admitting: Cardiovascular Disease

## 2020-11-28 VITALS — BP 140/91 | HR 90 | Ht 64.0 in | Wt 197.8 lb

## 2020-11-28 DIAGNOSIS — I10 Essential (primary) hypertension: Secondary | ICD-10-CM

## 2020-11-28 DIAGNOSIS — Z8679 Personal history of other diseases of the circulatory system: Secondary | ICD-10-CM

## 2020-11-28 MED ORDER — LOSARTAN POTASSIUM 25 MG PO TABS: 25.0000 mg | ORAL_TABLET | Freq: Every day | ORAL | 3 refills | Status: DC

## 2020-11-28 NOTE — Progress Notes (Signed)
Cardiology Office Note:    Date:  11/28/2020   ID:  Angie Manning, DOB 1965/09/02, MRN [id number]  PCP:  Kathyrn Lass, [place]  Cardiologist:  Sanda Klein, MD NEW Advanced Practice Provider:  No care team member to display Electrophysiologist:  None       Referring MD: Fanny Bien, MD   No chief complaint on file. Angie Manning is a 55 y.o. female who is being seen today for the evaluation of chest/left arm pain at the request of Fanny Bien, MD.   History of Present Illness:    Angie Manning is a 55 y.o. female with a hx of GERD and nephrolithiasis, moderate obesity, hypertension, prediabetes.    We met a few months ago when she had increasing frequency of palpitations and a shooting pain in her left arm.  To me it sounded like she was describing neuropathic pain since the discomfort radiating from her axilla to the medial aspect of her hand.  Today she gives me a little bit of a different description stating that the pain first occurred in her axilla then was more prominent in her forearm and hand and subsequently one of her fingers turned "black".  She is worried that was a thrombotic event.  Angie Manning had problems with hypertension, frequent PVCs PACs and even atrial fibrillation during her pregnancy in 2005.  She was prescribed medications and bedrest and all of these issues resolved after her delivery.  She has not had any cardiac problems since. She ran out of losartan about 3 weeks ago.  Her blood pressure at a recent visit with Dr. Sabra Heck was 140/90 and it is high again today.  The blood pressure is equal in the right and left upper extremities.  She had a normal treadmill stress test,  essentially normal echocardiogram (except for borderline left atrial dilation) and negative renal Dopplers for renal artery stenosis in the spring.  No history of kidney function abnormalities, although she does have a  longstanding history of kidney stones and has twice undergone surgery.  She underwent a CT scan in June 2021 for kidney stones.  I reviewed that study.  All the contrast is in the urinary collecting system and it is not an adequate test for renal artery stenosis, although there is no evidence of atherosclerotic calcification in the renal arteries, aorta or any of the other visualized vessels, including the bottom half of the cardiac silhouette.  No adenomas are seen in the adrenal glands on that study.  Past Medical History:  Diagnosis Date   Frequency of urination    GERD (gastroesophageal reflux disease)    Hypothyroidism    CURRENTLY NO MEDS   Right ureteral stone    Urgency of urination     Past Surgical History:  Procedure Laterality Date   CYSTOSCOPY/URETEROSCOPY/HOLMIUM LASER/STENT PLACEMENT Right 08/18/2018   Procedure: CYSTOSCOPY/URETEROSCOPY/STENT PLACEMENT basket stone extraction;  Surgeon: Lucas Mallow, MD;  Location: [facility];  Service: Urology;  Laterality: Right;   HOLMIUM LASER APPLICATION Right 3/88/8280   Procedure: HOLMIUM LASER APPLICATION;  Surgeon: Franchot Gallo, MD;  Location: [facility];  Service: Urology;  Laterality: Right;   LAPAROSCOPIC CHOLECYSTECTOMY  05-06-2000   URETEROSCOPY Right 03/15/2013   Procedure: URETEROSCOPY;  Surgeon: Franchot Gallo, MD;  Location: [facility];  Service: Urology;  Laterality: Right;    Current Medications: No outpatient medications have been marked as taking for the  11/28/20 encounter (Office Visit) with Valla Pacey, Dani Gobble, MD.     Allergies:   Shrimp [shellfish allergy]   Social History   Socioeconomic History   Marital status: Married    Spouse name: Not on file   Number of children: 3   Years of education: Not on file   Highest education level: Not on file  Occupational History   Not on file  Tobacco Use   Smoking status: Never   Smokeless tobacco: Never   Vaping Use   Vaping Use: Never used  Substance and Sexual Activity   Alcohol use: No   Drug use: No   Sexual activity: Yes  Other Topics Concern   Not on file  Social History Narrative   Not on file   Social Determinants of Health   Financial Resource Strain: Not on file  Food Insecurity: Not on file  Transportation Needs: Not on file  Physical Activity: Not on file  Stress: Not on file  Social Connections: Not on file     Family History: The patient's family history includes Breast cancer in her mother; Diabetes in her father; Heart failure in her father.  Her father had atrial fibrillation and an ablation procedure.  He passed away at age 32.  ROS:   Please see the history of present illness.    All other systems reviewed and are negative.  EKGs/Labs/Other Studies Reviewed:    The following studies were reviewed today: CT of the abdomen with contrast June 2021  ECHO 06/06/2020  1. Left ventricular ejection fraction, by estimation, is 65 to 70%. Left  ventricular ejection fraction by 3D volume is 70 %. The left ventricle has  normal function. The left ventricle has no regional wall motion  abnormalities. Indeterminate diastolic  filling due to E-A fusion.   2. Right ventricular systolic function is normal. The right ventricular  size is normal. There is normal pulmonary artery systolic pressure.   3. The mitral valve is grossly normal. No evidence of mitral valve  regurgitation.   4. The aortic valve is normal in structure. Aortic valve regurgitation is  not visualized. No aortic stenosis is present.   5. The inferior vena cava is normal in size with greater than 50%  respiratory variability, suggesting right atrial pressure of 3 mmHg.   Comparison(s): No prior Echocardiogram.   Stress test 05/20/2020    Blood pressure demonstrated a normal response to exercise. There was no ST segment deviation noted during stress.   Normal ETT Normal hemodynamic  response No significant arrhythmias  Renal Duplex US 05/13/2020  Largest Aortic Diameter: 2.2 cm     Renal:     Right: Normal size right kidney. Normal right Resistive Index.         Normal cortical thickness of right kidney. No evidence of         right renal artery stenosis. RRV flow present. Cyst(s) noted.         Avascular cystic structure noted in the upper pole of the         right kidney, measuring 2.1 cm.  Left:  Normal size of left kidney. Normal left Resistive Index.         Normal cortical thickness of the left kidney. No evidence of         left renal artery stenosis. LRV flow present.  Mesenteric:  Normal Celiac artery and Superior Mesenteric artery findings.     Patent IVC.      EKG:  EKG  is not ordered today.  The ekg ordered 04/30/2020 demonstrates normal sinus rhythm, normal tracing  Recent Labs: 04/30/2020: BUN 14; Creatinine, Ser 0.87; Potassium 5.0; Sodium 143  10/14/2020 Creatinine 0.77, potassium 4.3, ALT 31 A1c 5.9% Recent Lipid Panel No results found for: CHOL, TRIG, HDL, CHOLHDL, VLDL, LDLCALC, LDLDIRECT 10/14/2020 Cholesterol 200, HDL 47, LDL 121, triglycerides 178  Risk Assessment/Calculations:       Physical Exam:    VS:  BP 118/86 (BP Location: Left Arm, Patient Position: Sitting, Cuff Size: Large)   Pulse 90   Ht 5' 4"  (1.626 m)   Wt 197 lb 12.8 oz (89.7 kg)   SpO2 98%   BMI 33.95 kg/m     Wt Readings from Last 3 Encounters:  11/28/20 197 lb 12.8 oz (89.7 kg)  04/30/20 191 lb 9.6 oz (86.9 kg)  08/18/18 181 lb 9.6 oz (82.4 kg)     General: Alert, oriented x3, no distress, obese Head: no evidence of trauma, PERRL, EOMI, no exophtalmos or lid lag, no myxedema, no xanthelasma; normal ears, nose and oropharynx Neck: normal jugular venous pulsations and no hepatojugular reflux; brisk carotid pulses without delay and no carotid bruits Chest: clear to auscultation, no signs of consolidation by percussion or palpation, normal fremitus,  symmetrical and full respiratory excursions Cardiovascular: normal position and quality of the apical impulse, regular rhythm, normal first and second heart sounds, no murmurs, rubs or gallops Abdomen: no tenderness or distention, no masses by palpation, no abnormal pulsatility or arterial bruits, normal bowel sounds, no hepatosplenomegaly Extremities: no clubbing, cyanosis or edema; 2+ radial, ulnar and brachial pulses bilaterally; 2+ right femoral, posterior tibial and dorsalis pedis pulses; 2+ left femoral, posterior tibial and dorsalis pedis pulses; no subclavian or femoral bruits Neurological: grossly nonfocal Psych: Normal mood and affect   ASSESSMENT:    No diagnosis found.  PLAN:    In order of problems listed above:  Hx of Parox AFib: Because of the pattern of her symptoms with pain that gradually moved from her upper arm to her forearm to her finger she is concerned that she had a clot/embolic event.  This appears to be highly atypical for angina pectoris.  She wonders whether this could be a hereditary condition or whether was related to her COVID-vaccine.  I told her that my biggest concern is that we have might be missing recurrent paroxysmal atrial fibrillation (had AFib during pregnancy complications 7482), in which case she would qualify for anticoagulation.  Her left atrium was borderline enlarged on her recent echocardiogram (end-systolic diameter of 38 mm).  We discussed reliable ways to monitor for arrhythmia.  I do not think a 30-day monitor would really be sufficient.  I would recommend an implantable loop recorder.  Also suggested she could purchase a wearable device such as an apple watch, but she states she would probably never wear it.  She will think about the option for loop recorder. HTN: No evidence of renal artery stenosis.  Appears to have been well controlled on losartan, but her blood pressure is high today after being off the medication for 3 weeks.  I think she  needs to restart losartan 25 mg once daily. PreDM: Her LDL cholesterol is mildly elevated, but we have not identified any CAD or PAD.  Would advise lifestyle changes including regular exercise, weight loss, healthier diet.  This should also help with the borderline elevation in her hemoglobin A1c which is up to 5.9%.  Shared Decision Making/Informed Consent  The risks [  chest pain, shortness of breath, cardiac arrhythmias, dizziness, blood pressure fluctuations, myocardial infarction, stroke/transient ischemic attack, and life-threatening complications (estimated to be 1 in 10,000)], benefits (risk stratification, diagnosing coronary artery disease, treatment guidance) and alternatives of an exercise tolerance test were discussed in detail with Angie Manning and she agrees to proceed.    Medication Adjustments/Labs and Tests Ordered: Current medicines are reviewed at length with the patient today.  Concerns regarding medicines are outlined above.  No orders of the defined types were placed in this encounter.  No orders of the defined types were placed in this encounter.   There are no Patient Instructions on file for this visit.   Signed, Sanda Klein, MD  11/28/2020 8:00 AM     [facility]

## 2020-11-28 NOTE — Patient Instructions (Signed)
Medication Instructions:  No changes *If you need a refill on your cardiac medications before your next appointment, please call your pharmacy*   Lab Work: None ordered If you have labs (blood work) drawn today and your tests are completely normal, you will receive your results only by: [place] (if you have MyChart) OR A paper copy in the mail If you have any lab test that is abnormal or we need to change your treatment, we will call you to review the results.   Testing/Procedures: None ordered   Follow-Up: At [facility], you and your health needs are our priority.  As part of our continuing mission to provide you with exceptional heart care, we have created designated Provider Care Teams.  These Care Teams include your primary Cardiologist (physician) and Advanced Practice Providers (APPs -  Physician Assistants and Nurse Practitioners) who all work together to provide you with the care you need, when you need it.  We recommend signing up for the patient portal called "MyChart".  Sign up information is provided on this After Visit Summary.  MyChart is used to connect with patients for Virtual Visits (Telemedicine).  Patients are able to view lab/test results, encounter notes, upcoming appointments, etc.  Non-urgent messages can be sent to your provider as well.   To learn more about what you can do with MyChart, go to [email or web address].    Your next appointment:   12 month(s)  The format for your next appointment:   In Person  Provider:   Sanda Klein, MD     Other Instructions Dr. Sallyanne Kuster would like you to check your blood pressure daily for the next 2 weeks.  Keep a journal of these daily blood pressure and heart rate readings and call our office or send a message through [place] with the results. Thank you!  It is best to check your BP 1-2 hours after taking your medications to see the medications effectiveness on your BP.    Here are some tips that our  clinical pharmacists share for home BP monitoring:          Rest 10 minutes before taking your blood pressure.          Don't smoke or drink caffeinated beverages for at least 30 minutes before.          Take your blood pressure before (not after) you eat.          Sit comfortably with your back supported and both feet on the floor (don't cross your legs).          Elevate your arm to heart level on a table or a desk.          Use the proper sized cuff. It should fit smoothly and snugly around your bare upper arm. There should be enough room to slip a fingertip under the cuff. The bottom edge of the cuff should be 1 inch above the crease of the elbow.

## 2020-12-24 DIAGNOSIS — E063 Autoimmune thyroiditis: Secondary | ICD-10-CM | POA: Diagnosis not present

## 2020-12-24 DIAGNOSIS — E038 Other specified hypothyroidism: Secondary | ICD-10-CM | POA: Diagnosis not present

## 2020-12-29 ENCOUNTER — Other Ambulatory Visit: Payer: Self-pay | Admitting: Urology

## 2020-12-29 ENCOUNTER — Other Ambulatory Visit: Payer: Self-pay

## 2020-12-29 ENCOUNTER — Encounter (HOSPITAL_BASED_OUTPATIENT_CLINIC_OR_DEPARTMENT_OTHER): Payer: Self-pay | Admitting: Urology

## 2020-12-29 DIAGNOSIS — N132 Hydronephrosis with renal and ureteral calculous obstruction: Secondary | ICD-10-CM | POA: Diagnosis not present

## 2020-12-29 DIAGNOSIS — Z9049 Acquired absence of other specified parts of digestive tract: Secondary | ICD-10-CM | POA: Diagnosis not present

## 2020-12-29 DIAGNOSIS — R1084 Generalized abdominal pain: Secondary | ICD-10-CM | POA: Diagnosis not present

## 2020-12-29 DIAGNOSIS — N281 Cyst of kidney, acquired: Secondary | ICD-10-CM | POA: Diagnosis not present

## 2020-12-29 DIAGNOSIS — K5730 Diverticulosis of large intestine without perforation or abscess without bleeding: Secondary | ICD-10-CM | POA: Diagnosis not present

## 2020-12-29 DIAGNOSIS — N202 Calculus of kidney with calculus of ureter: Secondary | ICD-10-CM | POA: Diagnosis not present

## 2020-12-29 NOTE — Progress Notes (Signed)
COVID swab appointment:  N/A  COVID Vaccine Completed: Date COVID Vaccine completed: Has received booster: COVID vaccine manufacturer: [employer]   Date of COVID positive in last 90 days:  PCP - Kathyrn Lass, MD Cardiologist - Sanda Klein, MD  Chest x-ray -  EKG - 04-30-20 Epic Stress Test - 05-20-20 Epic ECHO - 06-06-20 Epic Cardiac Cath -  Pacemaker/ICD device last checked: Spinal Cord Stimulator:  Sleep Study -  CPAP -   Fasting Blood Sugar -  Checks Blood Sugar _____ times a day  Blood Thinner Instructions: Aspirin Instructions: Last Dose:  Activity level:  Can go up a flight of stairs and perform activities of daily living without stopping and without symptoms of chest pain or shortness of breath.   Able to exercise without symptoms  Unable to go up a flight of stairs without symptoms of      Anesthesia review:  Hx of Afib, palpitations.  HTN, prediabetes  Patient denies shortness of breath, fever, cough and chest pain at PAT appointment   Patient verbalized understanding of instructions that were given to them at the PAT appointment. Patient was also instructed that they will need to review over the PAT instructions again at home before surgery.

## 2020-12-29 NOTE — Anesthesia Preprocedure Evaluation (Addendum)
Anesthesia Evaluation  Patient identified by MRN, date of birth, ID band Patient awake    Reviewed: Allergy & Precautions, NPO status , Patient's Chart, lab work & pertinent test results  History of Anesthesia Complications (+) PONV, Family history of anesthesia reaction and history of anesthetic complications (mother w/ PONV, personal hx PONV but did well w/ last anesthetic 2020 (decadron, zofran))  Airway Mallampati: I  TM Distance: >3 FB Neck ROM: Full    Dental  (+) Teeth Intact, Dental Advisory Given   Pulmonary neg pulmonary ROS,    Pulmonary exam normal breath sounds clear to auscultation       Cardiovascular hypertension, Pt. on medications Normal cardiovascular exam+ dysrhythmias (PAF) Atrial Fibrillation  Rhythm:Regular Rate:Normal  Echo 05/2020: 1. Left ventricular ejection fraction, by estimation, is 65 to 70%. Left  ventricular ejection fraction by 3D volume is 70 %. The left ventricle has  normal function. The left ventricle has no regional wall motion  abnormalities. Indeterminate diastolic  filling due to E-A fusion.  2. Right ventricular systolic function is normal. The right ventricular  size is normal. There is normal pulmonary artery systolic pressure.  3. The mitral valve is grossly normal. No evidence of mitral valve  regurgitation.  4. The aortic valve is normal in structure. Aortic valve regurgitation is  not visualized. No aortic stenosis is present.  5. The inferior vena cava is normal in size with greater than 50%  respiratory variability, suggesting right atrial pressure of 3 mmHg.    Neuro/Psych negative neurological ROS  negative psych ROS   GI/Hepatic Neg liver ROS, GERD  Medicated and Controlled,  Endo/Other  Hypothyroidism BMI 33  Renal/GU Renal disease (right ureteral calculus)  negative genitourinary   Musculoskeletal negative musculoskeletal ROS (+)   Abdominal (+) + obese,    Peds  Hematology negative hematology ROS (+)   Anesthesia Other Findings   Reproductive/Obstetrics negative OB ROS                            Anesthesia Physical Anesthesia Plan  ASA: 2  Anesthesia Plan: General   Post-op Pain Management: Tylenol PO (pre-op) and Toradol IV (intra-op)   Induction: Intravenous  PONV Risk Score and Plan: 4 or greater and Ondansetron, Dexamethasone, Midazolam and Treatment may vary due to age or medical condition  Airway Management Planned: LMA  Additional Equipment: None  Intra-op Plan:   Post-operative Plan: Extubation in OR  Informed Consent: I have reviewed the patients History and Physical, chart, labs and discussed the procedure including the risks, benefits and alternatives for the proposed anesthesia with the patient or authorized representative who has indicated his/her understanding and acceptance.     Dental advisory given  Plan Discussed with: CRNA  Anesthesia Plan Comments:        Anesthesia Quick Evaluation

## 2020-12-29 NOTE — H&P (Signed)
/HPI: cc: flank pain   12/29/20: 55 year old woman with a history of urolithiasis that has required previous surgical intervention here with acute onset right flank pain and nausea. CT scan in the office shows a 5 mm mid ureteral calculus on the right with hydronephrosis. Patient states she is unable to pass stones on her own. Last surgeries were with Dr. Gloriann Loan and Dr. Diona Fanti. She does not tolerate stents well. No fever or chills. UA not concerning for infection.     ALLERGIES: No Allergies    MEDICATIONS: Hydrocodone-Acetaminophen 5 mg-325 mg tablet  Tirosint     GU PSH: Cysto Remove Stent FB Sim - 2020 Cysto Uretero Lithotripsy - 2015 Cystoscopy Insert Stent, Right - 2020 Ureteroscopic stone removal, Right - 2020       PSH Notes: Cystoscopy With Ureteroscopy With Lithotripsy, Cholecystectomy, Dermatological Surgery   NON-GU PSH: Cholecystectomy (open) - 2015     GU PMH: Flank Pain - 2021 LLQ pain - 2020 Microscopic hematuria - 2020 Ureteral calculus - 2020, (Stable), - 2020, Ureteral calculus, right, - 5638 Renal colic - 7564 Urinary Calculus, Unspec, Right, Right distal ureteral calculus, minimally to moderately symptomatic at this point. No obvious complicating factors. - 2020 Abdominal Pain Unspec, Right flank pain - 2016 Renal cyst, Renal cyst, acquired, right - 2016 Renal calculus, Nephrolithiasis - 2015    NON-GU PMH: Encounter for general adult medical examination without abnormal findings, Encounter for preventive health examination - 2016 Personal history of malignant melanoma of skin, History of malignant melanoma - 2015 Personal history of other diseases of the circulatory system, History of cardiac arrhythmia - 2015 Personal history of other diseases of the digestive system, History of esophageal reflux - 2015 Personal history of other endocrine, nutritional and metabolic disease, History of hypothyroidism - 2015 Hypothyroidism    FAMILY HISTORY: 2  daughters - Daughter 1 son - Son Death - Father Kidney Failure - Father Kidney Stones - Runs In Family malignant neoplasm of breast - Runs In Family   SOCIAL HISTORY: Marital Status: Married Preferred Language: English; Ethnicity: Not Hispanic Or Latino; Race: White Current Smoking Status: Patient has never smoked.   Tobacco Use Assessment Completed: Used Tobacco in last 30 days? Does not drink anymore.  Drinks 4+ caffeinated drinks per day.     Notes: Never a smoker, Number of children, Occupation, Caffeine use, Alcohol use, Married   REVIEW OF SYSTEMS:    GU Review Female:   Patient reports stream starts and stops, trouble starting your stream, and have to strain to urinate. Patient denies frequent urination, hard to postpone urination, burning /pain with urination, get up at night to urinate, leakage of urine, and being pregnant.  Gastrointestinal (Upper):   Patient reports nausea and vomiting. Patient denies indigestion/ heartburn.  Gastrointestinal (Lower):   Patient denies diarrhea and constipation.  Constitutional:   Patient reports fatigue. Patient denies fever, night sweats, and weight loss.  Skin:   Patient denies itching and skin rash/ lesion.  Eyes:   Patient denies blurred vision and double vision.  Ears/ Nose/ Throat:   Patient denies sore throat and sinus problems.  Hematologic/Lymphatic:   Patient denies swollen glands and easy bruising.  Cardiovascular:   Patient denies leg swelling and chest pains.  Respiratory:   Patient denies cough and shortness of breath.  Endocrine:   Patient denies excessive thirst.  Musculoskeletal:   Patient reports back pain. Patient denies joint pain.  Neurological:   Patient denies headaches and dizziness.  Psychologic:  Patient denies depression and anxiety.   Notes: right side low back and flank pain; unresponsive to hydrocodone    VITAL SIGNS:      12/29/2020 09:43 AM  Weight 190 lb / 86.18 kg  Height 64 in / 162.56 cm  BP  119/77 mmHg  Pulse 91 /min  Temperature 96.9 F / 36.0 C  BMI 32.6 kg/m   GU PHYSICAL EXAMINATION:      Notes: +Right CVA tendernes   MULTI-SYSTEM PHYSICAL EXAMINATION:    Constitutional: laying down on side, appears in pain  Neck: Neck symmetrical, not swollen. Normal tracheal position.  Respiratory: No labored breathing, no use of accessory muscles.   Skin: No paleness, no jaundice, no cyanosis. No lesion, no ulcer, no rash.  Neurologic / Psychiatric: Oriented to time, oriented to place, oriented to person. No depression, no anxiety, no agitation.  Gastrointestinal: +RLQ tenderness  Eyes: Normal conjunctivae. Normal eyelids.  Ears, Nose, Mouth, and Throat: Left ear no scars, no lesions, no masses. Right ear no scars, no lesions, no masses. Nose no scars, no lesions, no masses. Normal hearing. Normal lips.  Musculoskeletal: Normal gait and station of head and neck.     Complexity of Data:  Records Review:   Previous Patient Records, POC Tool  Urine Test Review:   Urinalysis  X-Ray Review: C.T. Abdomen/Pelvis: Reviewed Films. Discussed With Patient. 4 mm right distal ureteral calculus with mild hydronephrosis, bilateral nonobstructing renal calculi   Notes:                     09/24/2020: BUN 13, creatinine 0.77   PROCEDURES:         C.T. Urogram - [id number]      Patient confirmed No Neulasta OnPro Device.           Urinalysis w/Scope Dipstick Dipstick Cont'd Micro  Color: Yellow Bilirubin: Neg mg/dL WBC/hpf: 6 - 10/hpf  Appearance: Cloudy Ketones: Trace mg/dL RBC/hpf: 0 - 2/hpf  Specific Gravity: 1.025 Blood: 1+ ery/uL Bacteria: Few (10-25/hpf)  pH: 5.5 Protein: 1+ mg/dL Cystals: NS (Not Seen)  Glucose: Neg mg/dL Urobilinogen: 0.2 mg/dL Casts: NS (Not Seen)    Nitrites: Neg Trichomonas: Not Present    Leukocyte Esterase: Neg leu/uL Mucous: Present      Epithelial Cells: 20 - 40/hpf      Yeast: NS (Not Seen)      Sperm: Not Present    Notes: unspun specimen           Ketoralac 60mg  - [id number], [id number] Injection site prepped with alcohol. Medication administered IM into the left buttock using standard technique. Pt tolerated well. A band-aid was applied.    Qty: 60 Adm. By: Howell Pringle  Unit: mg Lot No [id number]  Route: IM Exp. Date 08/16/2021  Freq: None Mfgr.: [id number]  Site: Left Buttock         Morphine 4mg  - [id number], [id number] Injection site prepped with alcohol. Medication administered IM into the right buttock using standard technique. Pt tolerated well. A band-aid was applied.    Qty: 4 Adm. By: Howell Pringle  Unit: mg Lot No [id number]  Route: IM Exp. Date 05/18/2022  Freq: None Mfgr.: Hikma  Site: Right Buttock         Phenergan 25mg  - [id number], [id number] Injection site prepped with alcohol. Medication administered IM into the right buttock using standard technique. Pt tolerated well. A band-aid was applied.    Qty: 25 Adm. By: Howell Pringle  Unit: mg Lot No  [id number]  Route: IM Exp. Date 07/18/2022  Freq: None Mfgr.: [place]  Site: None   ASSESSMENT:      ICD-10 Details  1 GU:   Flank Pain - O03.55 Acute, Uncomplicated  2   Ureteral calculus - H74.1 Acute, Uncomplicated  3   Ureteral obstruction secondary to calculous - U38.4 Acute, Uncomplicated  4   Renal calculus - N20.0 Chronic, Stable   PLAN:            Medications New Meds: Ketorolac Tromethamine 10 mg tablet 1 tablet PO Q 8 H PRN kidney stone pain  #20  0 Refill(s)  Tamsulosin Hcl 0.4 mg capsule 1 capsule PO Q HS   #30  1 Refill(s)  Ondansetron Hcl 4 mg tablet 1 tablet PO Q 8 H PRN nausea  #30  0 Refill(s)  Oxycodone Hcl 5 mg tablet 1 tablet PO Q 8 H PRN severe kidney stone pain  #20  0 Refill(s)            Orders X-Rays: C.T. Abdomen/Pelvis Without I.V. Contrast  X-Ray Notes: History:  Hematuria: Yes/No  Patient to see MD after exam: Yes/No  Previous exam: CT / IVP/ US/ KUB/ None  When:  Where:  Diabetic: Yes/ No  BUN/ Creatinine:  Date of last BUN  Creatinine:  Weight in pounds:  Allergy- IV Contrast: Yes/ No  Conflicting diabetic meds: Yes/ No  Diabetic Meds:  Prior Authorization #Dillon Bjork # [id number] Valid 12/29/2020 thru 01/27/2021            Schedule         Document Letter(s):  Created for Patient: Clinical Summary         Notes:   1. Right ureteral calculus: Patient with severe pain and found to have 4 mm distal right ureteral calculus. We discussed management options including medical expulsive therapy, ureteroscopy laser lithotripsy and stent placement and ESWL. Stone is not visible well on scout for CT. Patient has been unable to pass stones in the past and would like to proceed with surgery. Risks and benefits of cystoscopy with right ureteroscopy, laser lithotripsy and stent placement discussed with patient in detail. She understands that a stent is required for this procedure. Risks include but are not limited to bleeding, infection, pain, need for staged procedure, need for additional treatment, stent pain, damage surrounding structures, stricture, inability remove stone, inability to place stent. Patient be scheduled for surgery tomorrow.

## 2020-12-29 NOTE — Progress Notes (Signed)
Spoke w/ via phone for pre-op interview--- pt Lab needs dos---- [employer] results------ current ekg in epic/ chart COVID test -----patient states asymptomatic no test needed Arrive at ------- 1100 on 12-30-2020 NPO after MN NO Solid Food.  Clear liquids from MN until--- 1000 Med rec completed Medications to take morning of surgery ----- tirosint, aciphex Diabetic medication ----- n/a Patient instructed no nail polish to be worn day of surgery Patient instructed to bring photo id and insurance card day of surgery Patient aware to have Driver (ride ) / caregiver for 24 hours after surgery --husband, [name] Patient Special Instructions ----- n/a Pre-Op special Istructions ----- pt was moved from [facility] today Patient verbalized understanding of instructions that were given at this phone interview. Patient denies shortness of breath, chest pain, fever, cough at this phone interview.   Anesthesia Review: HTN;  hx PAF during pregnancy in 2005;  PreDM Pt denies cardiac s&s, sob , and no peripheral swelling.  PCP: Dr L. [place] Cardiologist : Dr Sallyanne Kuster (lov 11-28-2020 epic) Endocrinologist:  Dr Althiemer Cassell Clement 07-04-2020 epic EKG : 04-30-2020 epic Echo : 06-06-2020 epic Stress test: ETT 05-20-2020 Cardiac Cath : no Blood Thinner/ Instructions Maryjane Hurter Dose: no ASA / Instructions/ Last Dose :  no

## 2020-12-30 ENCOUNTER — Encounter (HOSPITAL_BASED_OUTPATIENT_CLINIC_OR_DEPARTMENT_OTHER): Payer: Self-pay | Admitting: Urology

## 2020-12-30 ENCOUNTER — Ambulatory Visit (HOSPITAL_BASED_OUTPATIENT_CLINIC_OR_DEPARTMENT_OTHER): Payer: BC Managed Care – PPO | Admitting: Anesthesiology

## 2020-12-30 ENCOUNTER — Other Ambulatory Visit: Payer: Self-pay

## 2020-12-30 ENCOUNTER — Encounter (HOSPITAL_BASED_OUTPATIENT_CLINIC_OR_DEPARTMENT_OTHER)
Admission: RE | Disposition: A | Discharge status: Home / Self Care | Payer: Self-pay | Source: Home / Self Care | Attending: Urology

## 2020-12-30 ENCOUNTER — Ambulatory Visit (HOSPITAL_COMMUNITY)
Admission: RE | Admit: 2020-12-30 | Discharge: 2020-12-30 | Disposition: A | Discharge status: Home / Self Care | Payer: BC Managed Care – PPO | Attending: Urology | Admitting: Urology

## 2020-12-30 DIAGNOSIS — Z01818 Encounter for other preprocedural examination: Secondary | ICD-10-CM

## 2020-12-30 DIAGNOSIS — K219 Gastro-esophageal reflux disease without esophagitis: Secondary | ICD-10-CM | POA: Diagnosis not present

## 2020-12-30 DIAGNOSIS — N200 Calculus of kidney: Secondary | ICD-10-CM | POA: Diagnosis not present

## 2020-12-30 DIAGNOSIS — Z6833 Body mass index (BMI) 33.0-33.9, adult: Secondary | ICD-10-CM | POA: Diagnosis not present

## 2020-12-30 DIAGNOSIS — I4891 Unspecified atrial fibrillation: Secondary | ICD-10-CM | POA: Diagnosis not present

## 2020-12-30 DIAGNOSIS — I10 Essential (primary) hypertension: Secondary | ICD-10-CM | POA: Diagnosis not present

## 2020-12-30 DIAGNOSIS — E669 Obesity, unspecified: Secondary | ICD-10-CM | POA: Diagnosis not present

## 2020-12-30 DIAGNOSIS — E039 Hypothyroidism, unspecified: Secondary | ICD-10-CM | POA: Insufficient documentation

## 2020-12-30 DIAGNOSIS — N202 Calculus of kidney with calculus of ureter: Secondary | ICD-10-CM | POA: Diagnosis not present

## 2020-12-30 DIAGNOSIS — I2510 Atherosclerotic heart disease of native coronary artery without angina pectoris: Secondary | ICD-10-CM

## 2020-12-30 HISTORY — PX: CYSTOSCOPY WITH RETROGRADE PYELOGRAM, URETEROSCOPY AND STENT PLACEMENT: SHX5789

## 2020-12-30 HISTORY — DX: Personal history of urinary calculi: Z87.442

## 2020-12-30 HISTORY — DX: Other specified postprocedural states: R11.2

## 2020-12-30 HISTORY — DX: Other specified hypothyroidism: E03.8

## 2020-12-30 HISTORY — DX: Personal history of other diseases of the circulatory system: Z86.79

## 2020-12-30 HISTORY — DX: Calculus of ureter: N20.1

## 2020-12-30 HISTORY — DX: Prediabetes: R73.03

## 2020-12-30 HISTORY — PX: HOLMIUM LASER APPLICATION: SHX5852

## 2020-12-30 HISTORY — DX: Other specified postprocedural states: Z98.890

## 2020-12-30 HISTORY — DX: Autoimmune thyroiditis: E06.3

## 2020-12-30 HISTORY — DX: Family history of other specified conditions: Z84.89

## 2020-12-30 LAB — POCT I-STAT, CHEM 8
BUN: 16 mg/dL (ref 6–20)
Calcium, Ion: 1.21 mmol/L (ref 1.15–1.40)
Chloride: 106 mmol/L (ref 98–111)
Creatinine, Ser: 0.8 mg/dL (ref 0.44–1.00)
Glucose, Bld: 104 mg/dL — ABNORMAL HIGH (ref 70–99)
HCT: 38 % (ref 36.0–46.0)
Hemoglobin: 12.9 g/dL (ref 12.0–15.0)
Potassium: 3.7 mmol/L (ref 3.5–5.1)
Sodium: 142 mmol/L (ref 135–145)
TCO2: 22 mmol/L (ref 22–32)

## 2020-12-30 SURGERY — CYSTOURETEROSCOPY, WITH RETROGRADE PYELOGRAM AND STENT INSERTION
Anesthesia: General | Site: Pelvis | Laterality: Right

## 2020-12-30 MED ORDER — IOHEXOL 300 MG/ML  SOLN
INTRAMUSCULAR | Status: DC | PRN
  Administered 2020-12-30: 5 mL via URETHRAL

## 2020-12-30 MED ORDER — PROPOFOL 10 MG/ML IV BOLUS
INTRAVENOUS | Status: AC
  Filled 2020-12-30: qty 20

## 2020-12-30 MED ORDER — MIDAZOLAM HCL 5 MG/5ML IJ SOLN
INTRAMUSCULAR | Status: DC | PRN
Start: 2020-12-30 — End: 2020-12-30
  Administered 2020-12-30: 2 mg via INTRAVENOUS

## 2020-12-30 MED ORDER — AMISULPRIDE (ANTIEMETIC) 5 MG/2ML IV SOLN: 10.0000 mg | Freq: Once | INTRAVENOUS | Status: DC | PRN

## 2020-12-30 MED ORDER — LIDOCAINE 2% (20 MG/ML) 5 ML SYRINGE
INTRAMUSCULAR | Status: DC | PRN
Start: 2020-12-30 — End: 2020-12-30
  Administered 2020-12-30: 60 mg via INTRAVENOUS

## 2020-12-30 MED ORDER — CEFAZOLIN SODIUM-DEXTROSE 2-4 GM/100ML-% IV SOLN
INTRAVENOUS | Status: AC
  Filled 2020-12-30: qty 100

## 2020-12-30 MED ORDER — SODIUM CHLORIDE 0.9 % IR SOLN
Status: DC | PRN
  Administered 2020-12-30: 1000 mL

## 2020-12-30 MED ORDER — ACETAMINOPHEN 500 MG PO TABS
ORAL_TABLET | ORAL | Status: AC
  Filled 2020-12-30: qty 2

## 2020-12-30 MED ORDER — OXYCODONE HCL 5 MG/5ML PO SOLN: 5.0000 mg | Freq: Once | ORAL | Status: DC | PRN

## 2020-12-30 MED ORDER — CEPHALEXIN 500 MG PO CAPS: 500.0000 mg | ORAL_CAPSULE | Freq: Once | ORAL | 0 refills | Status: AC

## 2020-12-30 MED ORDER — LACTATED RINGERS IV SOLN: INTRAVENOUS | Status: DC

## 2020-12-30 MED ORDER — ONDANSETRON HCL 4 MG/2ML IJ SOLN
INTRAMUSCULAR | Status: AC
  Filled 2020-12-30: qty 2

## 2020-12-30 MED ORDER — ONDANSETRON HCL 4 MG/2ML IJ SOLN
INTRAMUSCULAR | Status: DC | PRN
  Administered 2020-12-30: 4 mg via INTRAVENOUS

## 2020-12-30 MED ORDER — ACETAMINOPHEN 500 MG PO TABS
1000.0000 mg | ORAL_TABLET | Freq: Once | ORAL | Status: AC
  Administered 2020-12-30: 1000 mg via ORAL

## 2020-12-30 MED ORDER — OXYCODONE HCL 5 MG PO TABS: 5.0000 mg | ORAL_TABLET | Freq: Once | ORAL | Status: DC | PRN

## 2020-12-30 MED ORDER — FENTANYL CITRATE (PF) 100 MCG/2ML IJ SOLN
INTRAMUSCULAR | Status: DC | PRN
  Administered 2020-12-30: 50 ug via INTRAVENOUS
  Administered 2020-12-30 (×2): 25 ug via INTRAVENOUS

## 2020-12-30 MED ORDER — MIDAZOLAM HCL 2 MG/2ML IJ SOLN
INTRAMUSCULAR | Status: AC
  Filled 2020-12-30: qty 2

## 2020-12-30 MED ORDER — PHENYLEPHRINE 40 MCG/ML (10ML) SYRINGE FOR IV PUSH (FOR BLOOD PRESSURE SUPPORT)
PREFILLED_SYRINGE | INTRAVENOUS | Status: DC | PRN
  Administered 2020-12-30: 80 ug via INTRAVENOUS

## 2020-12-30 MED ORDER — KETOROLAC TROMETHAMINE 30 MG/ML IJ SOLN
INTRAMUSCULAR | Status: DC | PRN
  Administered 2020-12-30: 30 mg via INTRAVENOUS

## 2020-12-30 MED ORDER — LIDOCAINE 2% (20 MG/ML) 5 ML SYRINGE
INTRAMUSCULAR | Status: AC
  Filled 2020-12-30: qty 5

## 2020-12-30 MED ORDER — PROMETHAZINE HCL 25 MG/ML IJ SOLN: 6.2500 mg | INTRAMUSCULAR | Status: DC | PRN

## 2020-12-30 MED ORDER — FENTANYL CITRATE (PF) 100 MCG/2ML IJ SOLN
INTRAMUSCULAR | Status: AC
  Filled 2020-12-30: qty 2

## 2020-12-30 MED ORDER — DEXAMETHASONE SODIUM PHOSPHATE 10 MG/ML IJ SOLN
INTRAMUSCULAR | Status: DC | PRN
  Administered 2020-12-30: 10 mg via INTRAVENOUS

## 2020-12-30 MED ORDER — DEXAMETHASONE SODIUM PHOSPHATE 10 MG/ML IJ SOLN
INTRAMUSCULAR | Status: AC
  Filled 2020-12-30: qty 1

## 2020-12-30 MED ORDER — CEFAZOLIN SODIUM-DEXTROSE 2-4 GM/100ML-% IV SOLN
2.0000 g | INTRAVENOUS | Status: AC
  Administered 2020-12-30: 2 g via INTRAVENOUS

## 2020-12-30 MED ORDER — PROPOFOL 10 MG/ML IV BOLUS
INTRAVENOUS | Status: DC | PRN
  Administered 2020-12-30: 150 mg via INTRAVENOUS

## 2020-12-30 MED ORDER — HYDROMORPHONE HCL 1 MG/ML IJ SOLN: 0.2500 mg | INTRAMUSCULAR | Status: DC | PRN

## 2020-12-30 MED ORDER — 0.9 % SODIUM CHLORIDE (POUR BTL) OPTIME
TOPICAL | Status: DC | PRN
  Administered 2020-12-30: 500 mL

## 2020-12-30 MED ORDER — KETOROLAC TROMETHAMINE 30 MG/ML IJ SOLN: 30.0000 mg | Freq: Once | INTRAMUSCULAR | Status: DC | PRN

## 2020-12-30 SURGICAL SUPPLY — 22 items
BAG DRAIN URO-CYSTO SKYTR STRL (DRAIN) ×3 IMPLANT
BAG DRN UROCATH (DRAIN) ×1
BASKET ZERO TIP NITINOL 2.4FR (BASKET) IMPLANT
BSKT STON RTRVL ZERO TP 2.4FR (BASKET)
CATH URET 5FR 28IN OPEN ENDED (CATHETERS) ×3 IMPLANT
CLOTH BEACON ORANGE TIMEOUT ST (SAFETY) ×1 IMPLANT
DRSG TEGADERM 4X4.75 (GAUZE/BANDAGES/DRESSINGS) IMPLANT
EXTRACTOR STONE 1.7FRX115CM (UROLOGICAL SUPPLIES) IMPLANT
GLOVE SURG ENC MOIS LTX SZ6.5 (GLOVE) ×3 IMPLANT
GOWN STRL REUS W/TWL LRG LVL3 (GOWN DISPOSABLE) ×3 IMPLANT
GUIDEWIRE STR DUAL SENSOR (WIRE) ×5 IMPLANT
IV NS IRRIG 3000ML ARTHROMATIC (IV SOLUTION) ×3 IMPLANT
KIT TURNOVER CYSTO (KITS) ×3 IMPLANT
MANIFOLD NEPTUNE II (INSTRUMENTS) ×3 IMPLANT
NS IRRIG 500ML POUR BTL (IV SOLUTION) ×2 IMPLANT
PACK CYSTO (CUSTOM PROCEDURE TRAY) ×3 IMPLANT
STENT PERCUFLEX 4.8FRX24 (STENTS) ×2 IMPLANT
TRACTIP FLEXIVA PULS ID 200XHI (Laser) IMPLANT
TRACTIP FLEXIVA PULSE ID 200 (Laser) ×3
TUBE CONNECTING 12'X1/4 (SUCTIONS) ×1
TUBE CONNECTING 12X1/4 (SUCTIONS) ×2 IMPLANT
TUBING UROLOGY SET (TUBING) ×3 IMPLANT

## 2020-12-30 NOTE — Anesthesia Postprocedure Evaluation (Signed)
Anesthesia Post Note  Patient: Angie Manning  Procedure(s) Performed: CYSTOSCOPY WITH RETROGRADE PYELOGRAM, URETEROSCOPY AND STENT PLACEMENT (Right: Pelvis) HOLMIUM LASER APPLICATION (Right: Pelvis)     Patient location during evaluation: PACU Anesthesia Type: General Level of consciousness: awake and alert, oriented and patient cooperative Pain management: pain level controlled Vital Signs Assessment: post-procedure vital signs reviewed and stable Respiratory status: spontaneous breathing, nonlabored ventilation and respiratory function stable Cardiovascular status: blood pressure returned to baseline and stable Postop Assessment: no apparent nausea or vomiting Anesthetic complications: no   No notable events documented.  Last Vitals:  Vitals:   12/30/20 1345 12/30/20 1400  BP: 122/83 124/80  Pulse: 80 80  Resp: 12 13  Temp:  36.7 C  SpO2: 95% 95%    Last Pain:  Vitals:   12/30/20 1400  TempSrc:   PainSc: [place]

## 2020-12-30 NOTE — Op Note (Signed)
Preoperative diagnosis: right ureteral calculus  Postoperative diagnosis: right renal calculus  Procedure:  Cystoscopy right ureteroscopy, laser lithotripsy, basket stone extraction right 4.56F x 24 ureteral stent placement with tether right retrograde pyelography with interpretation  Surgeon: Jacalyn Lefevre, MD  Anesthesia: General  Complications: None  Intraoperative findings:  Normal urethra Bilateral orthotropic ureteral orifices; right ureteral orifice with edema and mild erythema Right retrograde pyelography without filling defect or abnormalities Bladder mucosa normal without masses   EBL: Minimal  Specimens: none  Disposition of specimens: [facility] for stone analysis  Indication: Angie Manning is a 55 y.o.   patient with a 60mm distal right ureteral stone and associated right symptoms. After reviewing the management options for treatment, the patient elected to proceed with the above surgical procedure(s). We have discussed the potential benefits and risks of the procedure, side effects of the proposed treatment, the likelihood of the patient achieving the goals of the procedure, and any potential problems that might occur during the procedure or recuperation. Informed consent has been obtained.   Description of procedure:  The patient was taken to the operating room and general anesthesia was induced.  The patient was placed in the dorsal lithotomy position, prepped and draped in the usual sterile fashion, and preoperative antibiotics were administered. A preoperative time-out was performed.   Cystourethroscopy was performed.  The patients urethra was examined and was normal.  The bladder was then systematically examined in its entirety. There was no evidence for any bladder tumors, stones, or other mucosal pathology.    Attention then turned to the right ureteral orifice and a ureteral catheter was used to intubate the ureteral orifice.   Omnipaque contrast was injected through the ureteral catheter and a retrograde pyelogram was performed with findings as dictated above.  A 0.38 sensor guidewire was then advanced up the right ureter into the renal pelvis under fluoroscopic guidance. The 4.5 Fr semirigid ureteroscope was then advanced into the ureter next to the guidewire to the level of the renal pelvis.  No stone was visualized in the ureter.  The ureteral orifice was noted to be edematous and slightly erythematous consistent with just passing a stone.  A second wire was passed through the semirigid ureteroscope.  The flexible ureteroscope was then placed over the wire up to the kidney and pyeloscopy was performed.  There was an existing mid pole calculus noted on CT scan that was easily seen .  This was then fragmented/dusted with 200 m holmium laser fiber. Reinspection of the ureter revealed no remaining visible stones or fragments.   The wire was then backloaded through the cystoscope and a ureteral stent was advance over the wire using Seldinger technique.  The stent was positioned appropriately under fluoroscopic and cystoscopic guidance.  The wire was then removed with an adequate stent curl noted in the renal pelvis as well as in the bladder.  The bladder was then emptied and the procedure ended.  The patient appeared to tolerate the procedure well and without complications.  The patient was able to be awakened and transferred to the recovery unit in satisfactory condition.   Disposition: The tether of the stent was left on and  tucked inside the patient's vagina.  Instructions for removing the stent have been provided to the patient.

## 2020-12-30 NOTE — Discharge Instructions (Addendum)
DISCHARGE INSTRUCTIONS FOR KIDNEY STONE/URETERAL STENT   MEDICATIONS:  1. Resume all your other meds from home  2. AZO over the counter can help with the burning/stinging when you urinate. 3. Oxycodone is for moderate/severe pain, otherwise taking up to 1000 mg every 6 hours of plain Tylenol will help treat your pain.   4. Take Cephalexin one hour prior to removal of your stent.  5. Tamsulosin and ketorolac can help with stent discomfort   ACTIVITY:  1. No strenuous activity x 1week  2. No driving while on narcotic pain medications  3. Drink plenty of water  4. Continue to walk at home - you can still get blood clots when you are at home, so keep active, but don't over do it.  5. May return to work/school tomorrow or when you feel ready   BATHING:  1. You can shower and we recommend daily showers  2. You have a string coming from your urethra: The stent string is attached to your ureteral stent. Do not pull on this.   SIGNS/SYMPTOMS TO CALL:  Please call us if you have a fever greater than 101.5, uncontrolled nausea/vomiting, uncontrolled pain, dizziness, unable to urinate, bloody urine, chest pain, shortness of breath, leg swelling, leg pain, redness around wound, drainage from wound, or any other concerns or questions.   You can reach [place] at [phone].   FOLLOW-UP:  1. You have a string attached to your stent, you may remove it on Thursday, December 15. To do this, pull the strings until the stent is completely removed. You may feel an odd sensation in your back. Take your antibiotic 1 hour prior to stent removal.    Post Anesthesia Home Care Instructions  Activity: Get plenty of rest for the remainder of the day. A responsible individual must stay with you for 24 hours following the procedure.  For the next 24 hours, DO NOT: -Drive a car -Paediatric nurse -Drink alcoholic beverages -Take any medication unless instructed by your physician -Make any legal decisions or sign  important papers.  Meals: Start with liquid foods such as gelatin or soup. Progress to regular foods as tolerated. Avoid greasy, spicy, heavy foods. If nausea and/or vomiting occur, drink only clear liquids until the nausea and/or vomiting subsides. Call your physician if vomiting continues.  Special Instructions/Symptoms: Your throat may feel dry or sore from the anesthesia or the breathing tube placed in your throat during surgery. If this causes discomfort, gargle with warm salt water. The discomfort should disappear within 24 hours.  If you had a scopolamine patch placed behind your ear for the management of post- operative nausea and/or vomiting:  1. The medication in the patch is effective for 72 hours, after which it should be removed.  Wrap patch in a tissue and discard in the trash. Wash hands thoroughly with soap and water. 2. You may remove the patch earlier than 72 hours if you experience unpleasant side effects which may include dry mouth, dizziness or visual disturbances. 3. Avoid touching the patch. Wash your hands with soap and water after contact with the patch.      Toradol next dose 7:30pm or after. No Tylenol/acetaminophen products until 5:30pm or after.

## 2020-12-30 NOTE — Transfer of Care (Signed)
Immediate Anesthesia Transfer of Care Note  Patient: Angie Manning  Procedure(s) Performed: CYSTOSCOPY WITH RETROGRADE PYELOGRAM, URETEROSCOPY AND STENT PLACEMENT (Right: Pelvis) HOLMIUM LASER APPLICATION (Right: Pelvis)  Patient Location: PACU  Anesthesia Type:General  Level of Consciousness: awake, alert  and oriented  Airway & Oxygen Therapy: Patient Spontanous Breathing and Patient connected to face mask oxygen  Post-op Assessment: Report given to RN and Post -op Vital signs reviewed and stable  Post vital signs: Reviewed and stable  Last Vitals:  Vitals Value Taken Time  BP 116/71 12/30/20 1330  Temp 36.5 C 12/30/20 1330  Pulse 73 12/30/20 1335  Resp 11 12/30/20 1335  SpO2 99 % 12/30/20 1335  Vitals shown include unvalidated device data.  Last Pain:  Vitals:   12/30/20 1330  TempSrc:   PainSc: 0-No pain      Patients Stated Pain Goal: 5 (77/11/65 7903)  Complications: No notable events documented.

## 2020-12-30 NOTE — Anesthesia Procedure Notes (Signed)
Procedure Name: LMA Insertion Date/Time: 12/30/2020 12:56 PM Performed by: Genelle Bal, CRNA Pre-anesthesia Checklist: Patient identified, Emergency Drugs available, Suction available and Patient being monitored Patient Re-evaluated:Patient Re-evaluated prior to induction Oxygen Delivery Method: Circle system utilized Preoxygenation: Pre-oxygenation with 100% oxygen Induction Type: IV induction Ventilation: Mask ventilation without difficulty LMA: LMA inserted LMA Size: 4.0 Number of attempts: 1 Airway Equipment and Method: Bite block Placement Confirmation: positive ETCO2 Tube secured with: Tape Dental Injury: Teeth and Oropharynx as per pre-operative assessment

## 2020-12-30 NOTE — Interval H&P Note (Signed)
History and Physical Interval Note:  12/30/2020 11:45 AM  Stephana E Cranor-Cheek  has presented today for surgery, with the diagnosis of RIGHT URETERAL CALCULUS.  The various methods of treatment have been discussed with the patient and family. After consideration of risks, benefits and other options for treatment, the patient has consented to  Procedure(s): CYSTOSCOPY WITH RETROGRADE PYELOGRAM, URETEROSCOPY AND STENT PLACEMENT (Right) HOLMIUM LASER APPLICATION (Right) as a surgical intervention.  The patient's history has been reviewed, patient examined, no change in status, stable for surgery.  I have reviewed the patient's chart and labs.  Questions were answered to the patient's satisfaction.     [facility] D Katia Hannen

## 2020-12-31 ENCOUNTER — Encounter (HOSPITAL_BASED_OUTPATIENT_CLINIC_OR_DEPARTMENT_OTHER): Payer: Self-pay | Admitting: Urology

## 2021-01-27 DIAGNOSIS — N202 Calculus of kidney with calculus of ureter: Secondary | ICD-10-CM | POA: Diagnosis not present

## 2021-05-08 DIAGNOSIS — I10 Essential (primary) hypertension: Secondary | ICD-10-CM | POA: Diagnosis not present

## 2021-05-08 DIAGNOSIS — E063 Autoimmune thyroiditis: Secondary | ICD-10-CM | POA: Diagnosis not present

## 2021-05-08 DIAGNOSIS — E038 Other specified hypothyroidism: Secondary | ICD-10-CM | POA: Diagnosis not present

## 2021-05-25 NOTE — Progress Notes (Signed)
? ?Cardiology Clinic Note  ? ?Patient Name: Angie Manning ?Date of Encounter: 05/26/2021 ? ?Primary Care Provider:  Kathyrn Lass, MD ?Primary Cardiologist:  Sanda Klein, MD ? ?Patient Profile  ?  ?Angie Manning presents to the clinic today for evaluation of her chest pain and burning. ? ?Past Medical History  ?  ?Past Medical History:  ?Diagnosis Date  ? Family history of adverse reaction to anesthesia   ? mother--- ponv  ? Frequency of urination   ? GERD (gastroesophageal reflux disease)   ? History of atrial fibrillation   ? cardiologist-- dr croitoru (lov note in epic 11-28-2020)  normal ETT and echo in epic 05/ 2022;  PAF during pregnancy in 2005 w/ HTN/ PVCs/ PACs  ? History of COVID-19 05/2020  ? per pt moderate symptoms that resolved  ? History of kidney stones   ? History of melanoma excision 06/2000  ? per pt from leg, localized w/ no recurrence  ? Hypothyroidism due to Hashimoto's thyroiditis   ? endocrinologist--- dr althteiner;   chronic autoimmune , mild primary hypothyroidism  ? PONV (postoperative nausea and vomiting)   ? Pre-diabetes   ? Right ureteral calculus   ? Urgency of urination   ? ?Past Surgical History:  ?Procedure Laterality Date  ? CYSTOSCOPY WITH RETROGRADE PYELOGRAM, URETEROSCOPY AND STENT PLACEMENT Right 12/30/2020  ? Procedure: CYSTOSCOPY WITH RETROGRADE PYELOGRAM, URETEROSCOPY AND STENT PLACEMENT;  Surgeon: Robley Fries, MD;  Location: [facility];  Service: Urology;  Laterality: Right;  ? CYSTOSCOPY/URETEROSCOPY/HOLMIUM LASER/STENT PLACEMENT Right 08/18/2018  ? Procedure: CYSTOSCOPY/URETEROSCOPY/STENT PLACEMENT basket stone extraction;  Surgeon: Lucas Mallow, MD;  Location: [facility];  Service: Urology;  Laterality: Right;  ? HOLMIUM LASER APPLICATION Right 0/93/2355  ? Procedure: HOLMIUM LASER APPLICATION;  Surgeon: Franchot Gallo, MD;  Location: [facility];  Service: Urology;  Laterality: Right;  ?  HOLMIUM LASER APPLICATION Right 73/22/0254  ? Procedure: HOLMIUM LASER APPLICATION;  Surgeon: Robley Fries, MD;  Location: [facility];  Service: Urology;  Laterality: Right;  ? LAPAROSCOPIC CHOLECYSTECTOMY  05-06-2000  ? URETEROSCOPY Right 03/15/2013  ? Procedure: URETEROSCOPY;  Surgeon: Franchot Gallo, MD;  Location: [facility];  Service: Urology;  Laterality: Right;  ? ? ?Allergies ? ?Allergies  ?Allergen Reactions  ? Shrimp [Shellfish Allergy] Hives  ? ? ?History of Present Illness  ?  ?Angie Manning has a PMH of hypertension, GERD, nephrolithiasis, moderate obesity, and prediabetes.  She did have episodes of PVCs PACs and atrial fibrillation during pregnancy in 2005.  She was prescribed bedrest and medications.  Her issues resolved after delivery. ? ?She was seen initially by cardiology for increasing palpitations and pain that was shooting in her left arm.  The pain appeared to be related to neuropathic pain that was radiating from her axillary region to the medial portion of her hand.  She reported that one of her fingers to turn black.  She was worried about possible thrombus.  She underwent treadmill stress testing which was normal.  She also had an echocardiogram that was normal with a borderline left atrial dilation.  Her renal Dopplers were negative for renal artery stenosis. ? ?She presents to the clinic today for follow-up evaluation and states she had an episode of chest discomfort that was nitro responsive.  It was in the setting of physical activity.  She was helping her daughter pack for college.  She took one of her husbands nitroglycerin and  her chest discomfort resolved within 5 minutes.  We reviewed her previous echocardiogram and stress testing.  She expressed understanding.  We will progress with coronary CTA, ordered BMP, and have her follow-up after the test. ? ?Today she denies chest pain, shortness of breath, lower extremity edema, fatigue,  palpitations, melena, hematuria, hemoptysis, diaphoresis, weakness, presyncope, syncope, orthopnea, and PND. ? ?Home Medications  ?  ?Prior to Admission medications   ?Medication Sig Start Date End Date Taking? Authorizing Provider  ?losartan (COZAAR) 25 MG tablet Take 1 tablet (25 mg total) by mouth daily. ?Patient taking differently: Take 25 mg by mouth daily. 11/28/20 02/26/21  Croitoru, Mihai, MD  ?RABEprazole (ACIPHEX) 20 MG tablet Take 20 mg by mouth 2 (two) times daily as needed (for acid reflux).    [provider]  ?TIROSINT 125 MCG CAPS Take 1 capsule by mouth every morning. 10/14/20   [provider]  ? ? ?Family History  ?  ?Family History  ?Problem Relation Age of Onset  ? Breast cancer Mother   ? Diabetes Father   ? Heart failure Father   ? ?She indicated that her mother is alive. She indicated that her father is alive. ? ?Social History  ?  ?Social History  ? ?Socioeconomic History  ? Marital status: Married  ?  Spouse name: Not on file  ? Number of children: 3  ? Years of education: Not on file  ? Highest education level: Not on file  ?Occupational History  ? Not on file  ?Tobacco Use  ? Smoking status: Never  ? Smokeless tobacco: Never  ?Vaping Use  ? Vaping Use: Never used  ?Substance and Sexual Activity  ? Alcohol use: No  ? Drug use: Never  ? Sexual activity: Yes  ?  Birth control/protection: Post-menopausal  ?Other Topics Concern  ? Not on file  ?Social History Narrative  ? Not on file  ? ?Social Determinants of Health  ? ?Financial Resource Strain: Not on file  ?Food Insecurity: Not on file  ?Transportation Needs: Not on file  ?Physical Activity: Not on file  ?Stress: Not on file  ?Social Connections: Not on file  ?Intimate Partner Violence: Not on file  ?  ? ?Review of Systems  ?  ?General:  No chills, fever, night sweats or weight changes.  ?Cardiovascular:  No chest pain, dyspnea on exertion, edema, orthopnea, palpitations, paroxysmal nocturnal dyspnea. ?Dermatological: No  rash, lesions/masses ?Respiratory: No cough, dyspnea ?Urologic: No hematuria, dysuria ?Abdominal:   No nausea, vomiting, diarrhea, bright red blood per rectum, melena, or hematemesis ?Neurologic:  No visual changes, wkns, changes in mental status. ?All other systems reviewed and are otherwise negative except as noted above. ? ?Physical Exam  ?  ?VS:  BP (!) 140/100 (BP Location: Right Arm, Patient Position: Sitting, Cuff Size: Normal)   Pulse 81   Ht '5\' 4"'$  (1.626 m)   Wt 196 lb (88.9 kg)   LMP 11/01/2018 (Exact Date)   SpO2 98%   BMI 33.64 kg/m?  , BMI Body mass index is 33.64 kg/m?. ?GEN: Well nourished, well developed, in no acute distress. ?HEENT: normal. ?Neck: Supple, no JVD, carotid bruits, or masses. ?Cardiac: RRR, no murmurs, rubs, or gallops. No clubbing, cyanosis, edema.  Radials/DP/PT 2+ and equal bilaterally.  ?Respiratory:  Respirations regular and unlabored, clear to auscultation bilaterally. ?GI: Soft, nontender, nondistended, BS + x 4. ?MS: no deformity or atrophy. ?Skin: warm and dry, no rash. ?Neuro:  Strength and sensation are intact. ?Psych: Normal affect. ? ?  Accessory Clinical Findings  ?  ?Recent Labs: ?12/30/2020: BUN 16; Creatinine, Ser 0.80; Hemoglobin 12.9; Potassium 3.7; Sodium 142  ? ?Recent Lipid Panel ?No results found for: CHOL, TRIG, HDL, CHOLHDL, VLDL, LDLCALC, LDLDIRECT ? ?ECG personally reviewed by me today-normal sinus rhythm left axis deviation 81 bpm- No acute changes ? ?Echocardiogram 06/06/2020 ? ?IMPRESSIONS  ? ? ? 1. Left ventricular ejection fraction, by estimation, is 65 to 70%. Left  ?ventricular ejection fraction by 3D volume is 70 %. The left ventricle has  ?normal function. The left ventricle has no regional wall motion  ?abnormalities. Indeterminate diastolic  ?filling due to E-A fusion.  ? 2. Right ventricular systolic function is normal. The right ventricular  ?size is normal. There is normal pulmonary artery systolic pressure.  ? 3. The mitral valve is grossly  normal. No evidence of mitral valve  ?regurgitation.  ? 4. The aortic valve is normal in structure. Aortic valve regurgitation is  ?not visualized. No aortic stenosis is present.  ? 5. The inferior vena cava is normal

## 2021-05-26 ENCOUNTER — Ambulatory Visit (INDEPENDENT_AMBULATORY_CARE_PROVIDER_SITE_OTHER): Payer: BC Managed Care – PPO | Admitting: General Practice

## 2021-05-26 ENCOUNTER — Encounter: Payer: Self-pay | Admitting: General Practice

## 2021-05-26 VITALS — BP 140/100 | HR 81 | Ht 64.0 in | Wt 196.0 lb

## 2021-05-26 DIAGNOSIS — R079 Chest pain, unspecified: Secondary | ICD-10-CM | POA: Diagnosis not present

## 2021-05-26 DIAGNOSIS — I10 Essential (primary) hypertension: Secondary | ICD-10-CM

## 2021-05-26 DIAGNOSIS — R7303 Prediabetes: Secondary | ICD-10-CM

## 2021-05-26 DIAGNOSIS — I480 Paroxysmal atrial fibrillation: Secondary | ICD-10-CM

## 2021-05-26 LAB — BASIC METABOLIC PANEL
BUN/Creatinine Ratio: 19 (ref 9–23)
BUN: 14 mg/dL (ref 6–24)
CO2: 21 mmol/L (ref 20–29)
Calcium: 9.5 mg/dL (ref 8.7–10.2)
Chloride: 104 mmol/L (ref 96–106)
Creatinine, Ser: 0.75 mg/dL (ref 0.57–1.00)
Glucose: 97 mg/dL (ref 70–99)
Potassium: 4.3 mmol/L (ref 3.5–5.2)
Sodium: 139 mmol/L (ref 134–144)
eGFR: 95 mL/min/{1.73_m2} (ref >59)

## 2021-05-26 MED ORDER — DIPHENHYDRAMINE HCL 50 MG PO TABS: ORAL_TABLET | ORAL | 0 refills | Status: DC

## 2021-05-26 MED ORDER — PREDNISONE 50 MG PO TABS: ORAL_TABLET | ORAL | 0 refills | Status: DC

## 2021-05-26 NOTE — Patient Instructions (Addendum)
Medication Instructions:  ?Your Physician recommend you continue on your current medication as directed.   ? ?*If you need a refill on your cardiac medications before your next appointment, please call your pharmacy* ? ? ?Lab Work: ?Today BMET ?If you have labs (blood work) drawn today and your tests are completely normal, you will receive your results only by: ?MyChart Message (if you have MyChart) OR ?A paper copy in the mail ?If you have any lab test that is abnormal or we need to change your treatment, we will call you to review the results. ? ? ?Testing/Procedures: ? ? ?Your cardiac CT will be scheduled at one of the below locations:  ? ?[facility] ?[address] ?[place], [place] [address] ?[phone] ? ? ?If scheduled at [facility], please arrive at the [facility] and Children's Entrance (Entrance C2) of [facility] 30 minutes prior to test start time. ?You can use the FREE valet parking offered at entrance C (encouraged to control the heart rate for the test)  ?Proceed to the [facility] Radiology Department (first floor) to check-in and test prep. ? ?All radiology patients and guests should use entrance C2 at [facility], accessed from [facility], even though the hospital's physical address listed is [address]. ? ? ? ?Please follow these instructions carefully (unless otherwise directed): ? ?On the Night Before the Test: ?Be sure to Drink plenty of water. ?Do not consume any caffeinated/decaffeinated beverages or chocolate 12 hours prior to your test. ?Do not take any antihistamines 12 hours prior to your test. ?If the patient has contrast allergy: ?Patient will need a prescription for Prednisone and very clear instructions (as follows): ?Prednisone 50 mg - take 13 hours prior to test ?Take another Prednisone 50 mg 7 hours prior to test ?Take another Prednisone 50 mg 1 hour prior to test ?Take Benadryl 50 mg 1 hour prior to test ?Patient must  complete all four doses of above prophylactic medications. ?Patient will need a ride after test due to Benadryl. ? ?On the Day of the Test: ?Drink plenty of water until 1 hour prior to the test. ?Do not eat any food 4 hours prior to the test. ?You may take your regular medications prior to the test.  ?Take metoprolol (Lopressor) 100 mg two hours prior to test. ?FEMALES- please wear underwire-free bra if available, avoid dresses & tight clothing ? ?    ?After the Test: ?Drink plenty of water. ?After receiving IV contrast, you may experience a mild flushed feeling. This is normal. ?On occasion, you may experience a mild rash up to 24 hours after the test. This is not dangerous. If this occurs, you can take Benadryl 25 mg and increase your fluid intake. ?If you experience trouble breathing, this can be serious. If it is severe call 911 IMMEDIATELY. If it is mild, please call our office. ?If you take any of these medications: Glipizide/Metformin, Avandament, Glucavance, please do not take 48 hours after completing test unless otherwise instructed. ? ?We will call to schedule your test 2-4 weeks out understanding that some insurance companies will need an authorization prior to the service being performed.  ? ?For non-scheduling related questions, please contact the cardiac imaging nurse navigator should you have any questions/concerns: ?Marchia Bond, Cardiac Imaging Nurse Navigator ?Gordy Clement, Cardiac Imaging Nurse Navigator ?[facility] ?Direct Office Dial: [phone]  ? ?For scheduling needs, including cancellations and rescheduling, please call [place], [phone]. ? ? ?  Follow-Up: ?At [facility], you and your health needs are our priority.  As part of our continuing mission to provide you with exceptional heart care, we have created designated Provider Care Teams.  These Care Teams include your primary Cardiologist (physician) and Advanced Practice Providers (APPs -  Physician  Assistants and Nurse Practitioners) who all work together to provide you with the care you need, when you need it. ? ?We recommend signing up for the patient portal called "MyChart".  Sign up information is provided on this After Visit Summary.  MyChart is used to connect with patients for Virtual Visits (Telemedicine).  Patients are able to view lab/test results, encounter notes, upcoming appointments, etc.  Non-urgent messages can be sent to your provider as well.   ?To learn more about what you can do with MyChart, go to [email or web address].   ? ?Your next appointment:    ?After test ? ?The format for your next appointment:   ?In Person ? ?Provider:   ?Coletta Memos, FNP     ? ? ? ?

## 2021-05-28 MED ORDER — METOPROLOL TARTRATE 100 MG PO TABS: 100.0000 mg | ORAL_TABLET | Freq: Once | ORAL | 0 refills | Status: DC

## 2021-05-28 NOTE — Telephone Encounter (Signed)
*  For Clinical Staff only. Please instruct patient the following:* ?Heart Rate Medication Recommendations for Cardiac CT  ? ?Resting HR > 65 bpm and BP >110/50 mmHG  ?Metoprolol tartrate 100 mg PO 90-120 minutes prior to scan  ? ?Per guidelines- patients HR was 81 at recent OV, and BP was 140/100. Order placed for Metoprolol '100mg'$  to be take 2  hours prior to CCTA scan.  ? ?Will forward to [place], NP to make aware.  ? ? ?

## 2021-06-08 ENCOUNTER — Other Ambulatory Visit: Payer: Self-pay | Admitting: Obstetrics and Gynecology

## 2021-06-08 DIAGNOSIS — N644 Mastodynia: Secondary | ICD-10-CM | POA: Diagnosis not present

## 2021-06-10 ENCOUNTER — Telehealth (HOSPITAL_COMMUNITY): Payer: Self-pay | Admitting: Emergency Medicine

## 2021-06-10 NOTE — Telephone Encounter (Signed)
Attempted to call patient regarding upcoming cardiac CT appointment. °Left message on voicemail with name and callback number °Cherokee Clowers RN Navigator Cardiac Imaging °[facility] °[phone] Office °[phone] Cell ° °

## 2021-06-11 ENCOUNTER — Ambulatory Visit (HOSPITAL_COMMUNITY)
Admission: RE | Admit: 2021-06-11 | Discharge: 2021-06-11 | Disposition: A | Discharge status: Home / Self Care | Payer: BC Managed Care – PPO | Source: Ambulatory Visit | Attending: General Practice | Admitting: General Practice

## 2021-06-11 DIAGNOSIS — R079 Chest pain, unspecified: Secondary | ICD-10-CM | POA: Insufficient documentation

## 2021-06-11 DIAGNOSIS — I480 Paroxysmal atrial fibrillation: Secondary | ICD-10-CM | POA: Diagnosis not present

## 2021-06-11 DIAGNOSIS — I10 Essential (primary) hypertension: Secondary | ICD-10-CM | POA: Insufficient documentation

## 2021-06-11 DIAGNOSIS — R7303 Prediabetes: Secondary | ICD-10-CM | POA: Insufficient documentation

## 2021-06-11 MED ORDER — DILTIAZEM HCL 25 MG/5ML IV SOLN
INTRAVENOUS | Status: AC
  Filled 2021-06-11: qty 5

## 2021-06-11 MED ORDER — IOHEXOL 350 MG/ML SOLN
100.0000 mL | Freq: Once | INTRAVENOUS | Status: AC | PRN
  Administered 2021-06-11: 100 mL via INTRAVENOUS

## 2021-06-11 MED ORDER — METOPROLOL TARTRATE 5 MG/5ML IV SOLN
INTRAVENOUS | Status: AC
  Administered 2021-06-11: 10 mg via INTRAVENOUS
  Filled 2021-06-11: qty 10

## 2021-06-11 MED ORDER — NITROGLYCERIN 0.4 MG SL SUBL
0.8000 mg | SUBLINGUAL_TABLET | Freq: Once | SUBLINGUAL | Status: AC
  Administered 2021-06-11: 0.8 mg via SUBLINGUAL

## 2021-06-11 MED ORDER — DILTIAZEM HCL 25 MG/5ML IV SOLN
10.0000 mg | Freq: Once | INTRAVENOUS | Status: AC
  Administered 2021-06-11: 10 mg via INTRAVENOUS

## 2021-06-11 MED ORDER — METOPROLOL TARTRATE 5 MG/5ML IV SOLN: 10.0000 mg | Freq: Once | INTRAVENOUS | Status: AC

## 2021-06-11 MED ORDER — NITROGLYCERIN 0.4 MG SL SUBL
SUBLINGUAL_TABLET | SUBLINGUAL | Status: AC
  Filled 2021-06-11: qty 2

## 2021-06-16 ENCOUNTER — Ambulatory Visit
Admission: RE | Admit: 2021-06-16 | Discharge: 2021-06-16 | Disposition: A | Discharge status: Home / Self Care | Payer: BC Managed Care – PPO | Source: Ambulatory Visit | Attending: Obstetrics and Gynecology | Admitting: Obstetrics and Gynecology

## 2021-06-16 ENCOUNTER — Ambulatory Visit: Payer: BC Managed Care – PPO

## 2021-06-16 DIAGNOSIS — R922 Inconclusive mammogram: Secondary | ICD-10-CM | POA: Diagnosis not present

## 2021-06-16 DIAGNOSIS — N644 Mastodynia: Secondary | ICD-10-CM

## 2021-08-06 DIAGNOSIS — N390 Urinary tract infection, site not specified: Secondary | ICD-10-CM | POA: Diagnosis not present

## 2021-08-06 DIAGNOSIS — R300 Dysuria: Secondary | ICD-10-CM | POA: Diagnosis not present

## 2021-09-14 DIAGNOSIS — N393 Stress incontinence (female) (male): Secondary | ICD-10-CM | POA: Diagnosis not present

## 2021-09-14 DIAGNOSIS — N200 Calculus of kidney: Secondary | ICD-10-CM | POA: Diagnosis not present

## 2021-09-22 DIAGNOSIS — Z1151 Encounter for screening for human papillomavirus (HPV): Secondary | ICD-10-CM | POA: Diagnosis not present

## 2021-09-22 DIAGNOSIS — Z124 Encounter for screening for malignant neoplasm of cervix: Secondary | ICD-10-CM | POA: Diagnosis not present

## 2021-09-22 DIAGNOSIS — Z01419 Encounter for gynecological examination (general) (routine) without abnormal findings: Secondary | ICD-10-CM | POA: Diagnosis not present

## 2021-09-22 DIAGNOSIS — Z6834 Body mass index (BMI) 34.0-34.9, adult: Secondary | ICD-10-CM | POA: Diagnosis not present

## 2021-10-06 DIAGNOSIS — M6281 Muscle weakness (generalized): Secondary | ICD-10-CM | POA: Diagnosis not present

## 2021-10-06 DIAGNOSIS — N393 Stress incontinence (female) (male): Secondary | ICD-10-CM | POA: Diagnosis not present

## 2021-10-06 DIAGNOSIS — M6289 Other specified disorders of muscle: Secondary | ICD-10-CM | POA: Diagnosis not present

## 2021-10-21 DIAGNOSIS — I10 Essential (primary) hypertension: Secondary | ICD-10-CM | POA: Diagnosis not present

## 2021-10-21 DIAGNOSIS — E039 Hypothyroidism, unspecified: Secondary | ICD-10-CM | POA: Diagnosis not present

## 2021-10-21 DIAGNOSIS — K219 Gastro-esophageal reflux disease without esophagitis: Secondary | ICD-10-CM | POA: Diagnosis not present

## 2021-10-21 DIAGNOSIS — R7303 Prediabetes: Secondary | ICD-10-CM | POA: Diagnosis not present

## 2021-10-21 DIAGNOSIS — Z23 Encounter for immunization: Secondary | ICD-10-CM | POA: Diagnosis not present

## 2021-10-21 DIAGNOSIS — Z0000 Encounter for general adult medical examination without abnormal findings: Secondary | ICD-10-CM | POA: Diagnosis not present

## 2021-10-27 DIAGNOSIS — M62838 Other muscle spasm: Secondary | ICD-10-CM | POA: Diagnosis not present

## 2021-10-27 DIAGNOSIS — N393 Stress incontinence (female) (male): Secondary | ICD-10-CM | POA: Diagnosis not present

## 2021-10-27 DIAGNOSIS — M6281 Muscle weakness (generalized): Secondary | ICD-10-CM | POA: Diagnosis not present

## 2021-10-27 DIAGNOSIS — M6289 Other specified disorders of muscle: Secondary | ICD-10-CM | POA: Diagnosis not present

## 2021-12-03 ENCOUNTER — Other Ambulatory Visit: Payer: Self-pay | Admitting: Cardiovascular Disease

## 2021-12-31 DIAGNOSIS — M25531 Pain in right wrist: Secondary | ICD-10-CM | POA: Diagnosis not present

## 2022-01-06 DIAGNOSIS — M25531 Pain in right wrist: Secondary | ICD-10-CM | POA: Diagnosis not present

## 2022-01-19 DIAGNOSIS — M25531 Pain in right wrist: Secondary | ICD-10-CM | POA: Diagnosis not present

## 2022-01-28 DIAGNOSIS — M25531 Pain in right wrist: Secondary | ICD-10-CM | POA: Diagnosis not present

## 2022-02-09 DIAGNOSIS — Z789 Other specified health status: Secondary | ICD-10-CM | POA: Diagnosis not present

## 2022-02-11 DIAGNOSIS — Z789 Other specified health status: Secondary | ICD-10-CM | POA: Diagnosis not present

## 2022-02-11 DIAGNOSIS — M25531 Pain in right wrist: Secondary | ICD-10-CM | POA: Diagnosis not present

## 2022-02-16 DIAGNOSIS — Z789 Other specified health status: Secondary | ICD-10-CM | POA: Diagnosis not present

## 2022-02-16 DIAGNOSIS — M25531 Pain in right wrist: Secondary | ICD-10-CM | POA: Diagnosis not present

## 2022-02-18 DIAGNOSIS — Z789 Other specified health status: Secondary | ICD-10-CM | POA: Diagnosis not present

## 2022-02-18 DIAGNOSIS — E063 Autoimmune thyroiditis: Secondary | ICD-10-CM | POA: Diagnosis not present

## 2022-02-18 DIAGNOSIS — I10 Essential (primary) hypertension: Secondary | ICD-10-CM | POA: Diagnosis not present

## 2022-02-18 DIAGNOSIS — M25531 Pain in right wrist: Secondary | ICD-10-CM | POA: Diagnosis not present

## 2022-02-23 DIAGNOSIS — M25531 Pain in right wrist: Secondary | ICD-10-CM | POA: Diagnosis not present

## 2022-03-02 DIAGNOSIS — M25531 Pain in right wrist: Secondary | ICD-10-CM | POA: Diagnosis not present

## 2022-03-02 DIAGNOSIS — M778 Other enthesopathies, not elsewhere classified: Secondary | ICD-10-CM | POA: Diagnosis not present

## 2022-04-22 DIAGNOSIS — H6991 Unspecified Eustachian tube disorder, right ear: Secondary | ICD-10-CM | POA: Diagnosis not present

## 2022-04-22 DIAGNOSIS — J209 Acute bronchitis, unspecified: Secondary | ICD-10-CM | POA: Diagnosis not present

## 2022-04-28 DIAGNOSIS — E038 Other specified hypothyroidism: Secondary | ICD-10-CM | POA: Diagnosis not present

## 2022-04-28 DIAGNOSIS — E063 Autoimmune thyroiditis: Secondary | ICD-10-CM | POA: Diagnosis not present

## 2022-04-30 DIAGNOSIS — E063 Autoimmune thyroiditis: Secondary | ICD-10-CM | POA: Diagnosis not present

## 2022-04-30 DIAGNOSIS — E669 Obesity, unspecified: Secondary | ICD-10-CM | POA: Diagnosis not present

## 2022-04-30 DIAGNOSIS — Z6834 Body mass index (BMI) 34.0-34.9, adult: Secondary | ICD-10-CM | POA: Diagnosis not present

## 2022-04-30 DIAGNOSIS — E038 Other specified hypothyroidism: Secondary | ICD-10-CM | POA: Diagnosis not present

## 2022-05-07 IMAGING — US US ABDOMEN LIMITED
1 series · 14 of 25 positions shown · non-contrast
Comparison: CT abdomen and pelvis 06/07/2019

CLINICAL DATA: Right upper quadrant pain since 06/02/2019. Previous
cholecystectomy.

EXAM:
ULTRASOUND ABDOMEN LIMITED RIGHT UPPER QUADRANT

[Series 1: us abdomen limited · 0.19mm/px · 14 of 30 slices shown]
[im 1/30]
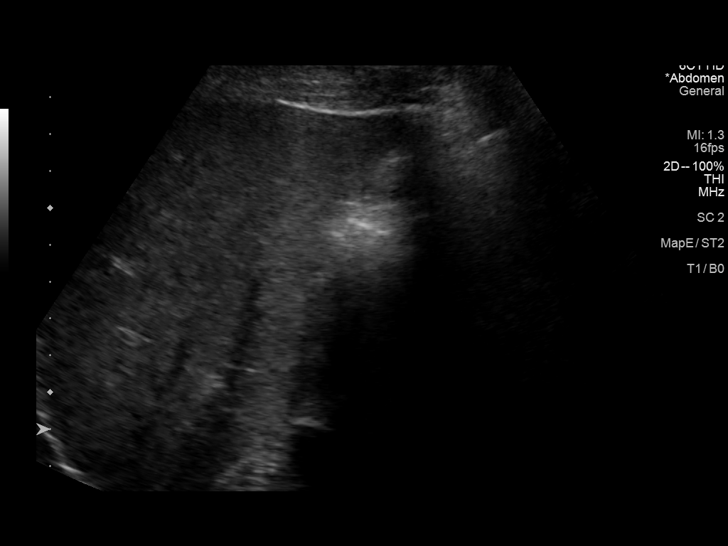
[im 3/30]
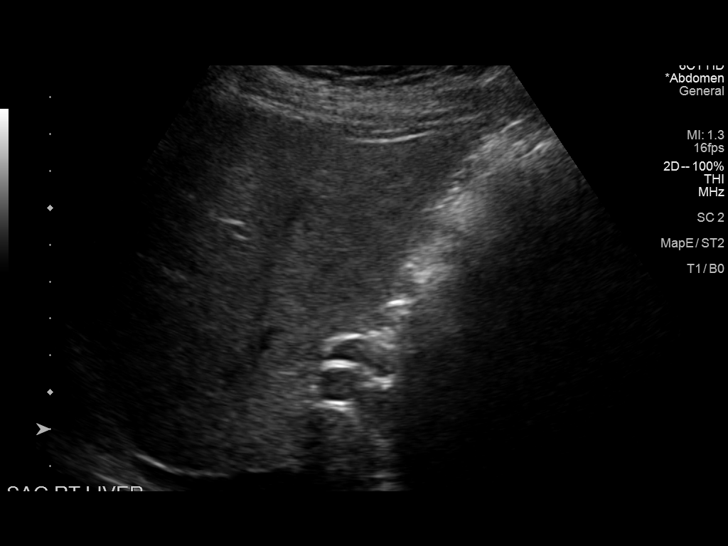
[im 5/30]
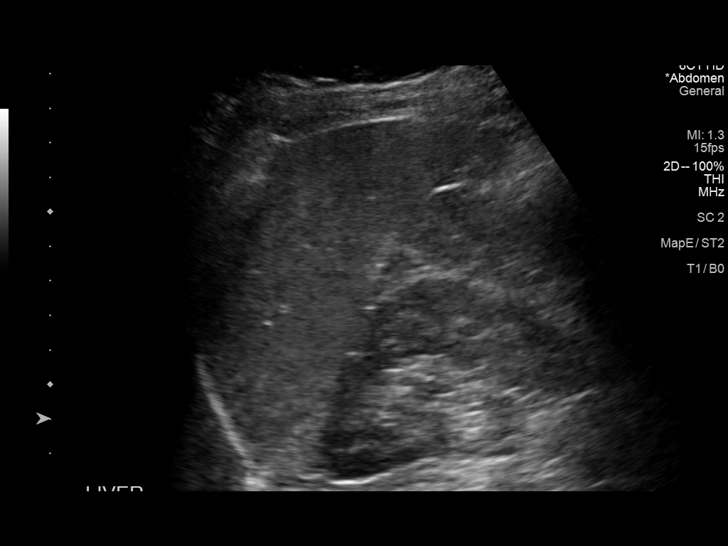
[im 8/30]
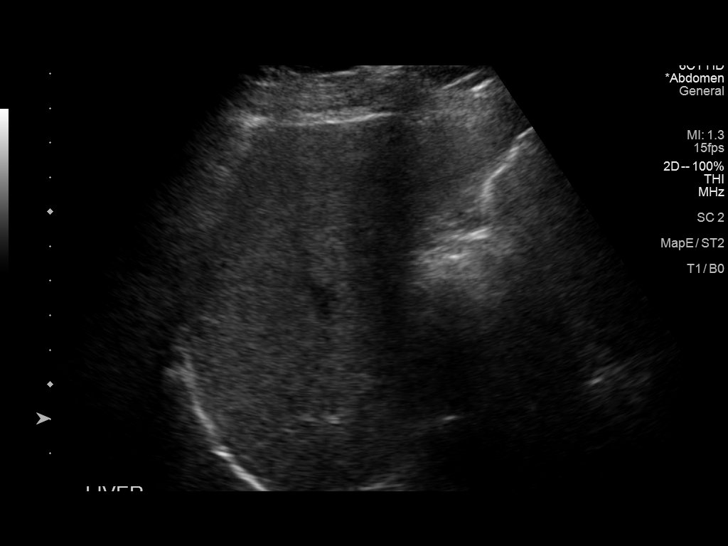
[im 10/30]
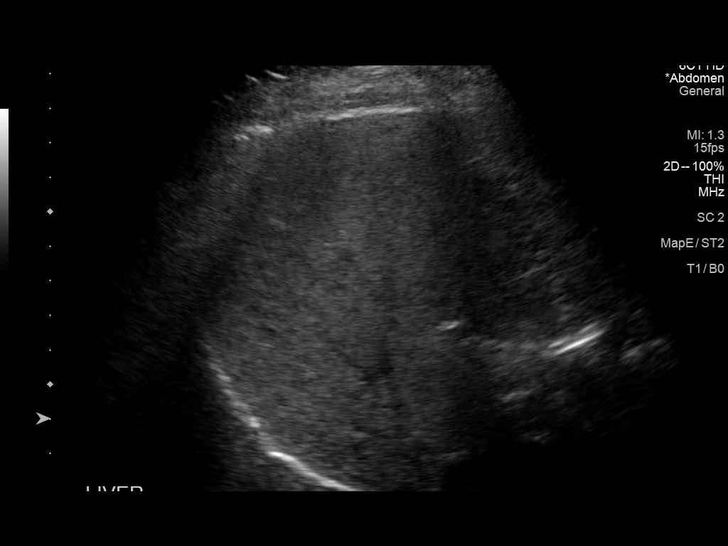
[im 11/30]
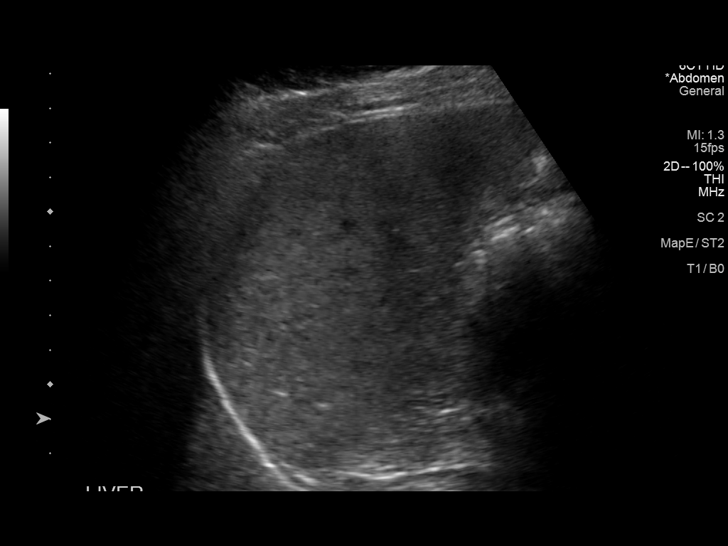
[im 14/30]
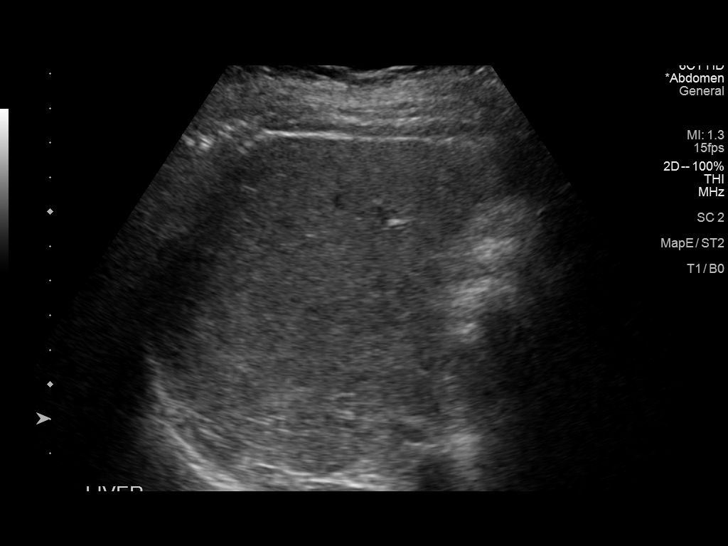
[im 16/30]
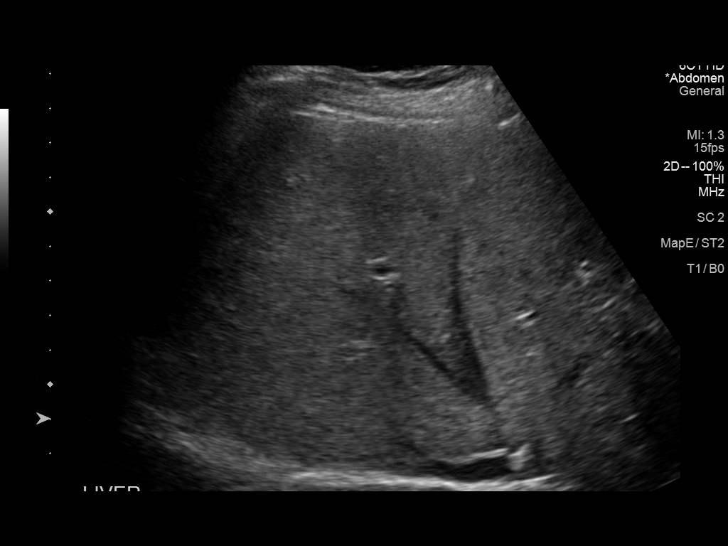
[im 19/30]
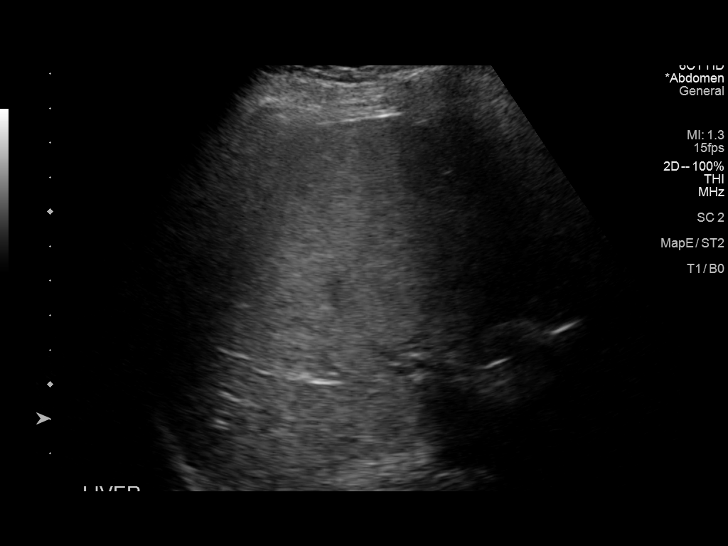
[im 20/30]
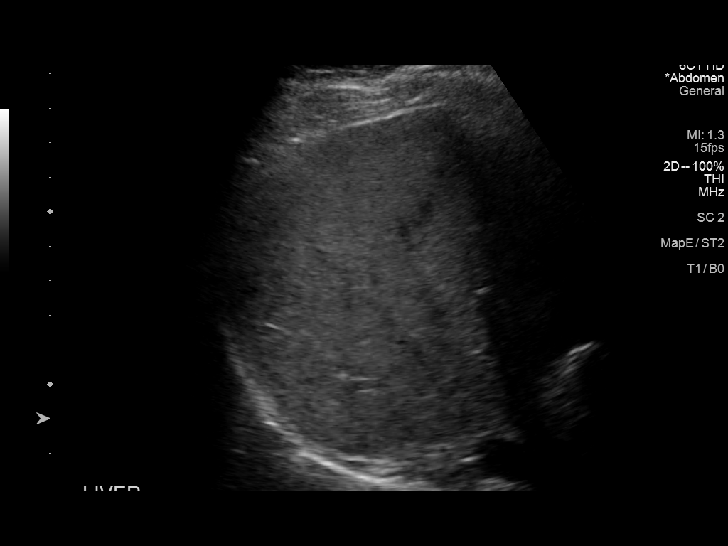
[im 22/30]
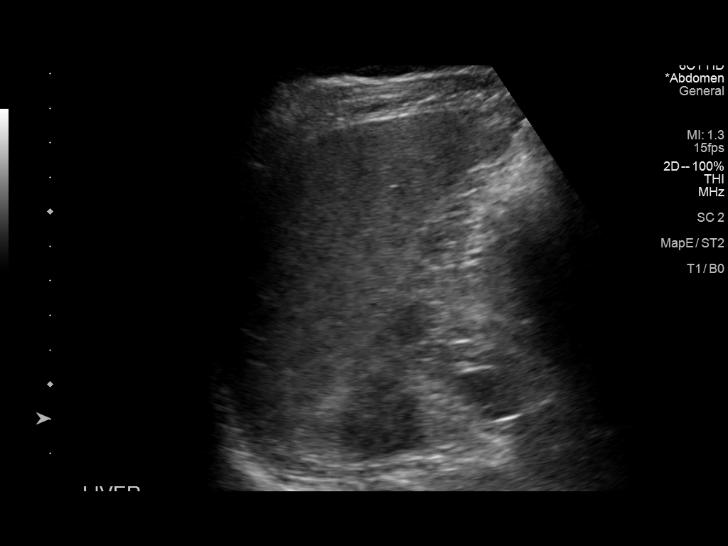
[im 25/30]
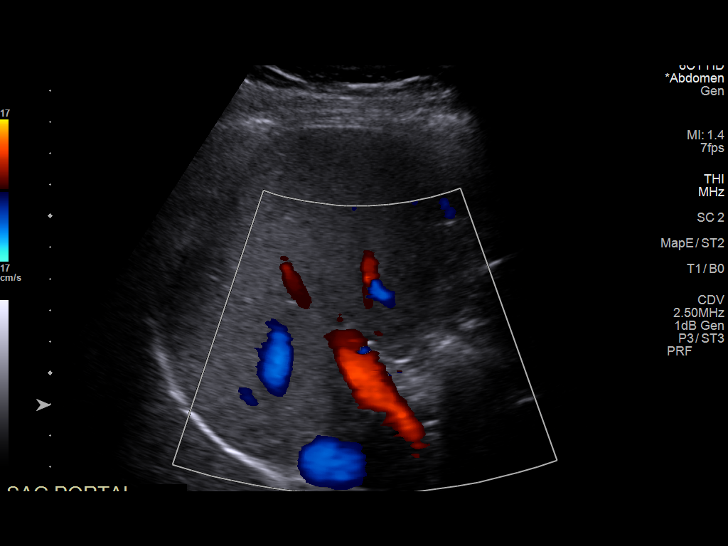
[im 27/30]
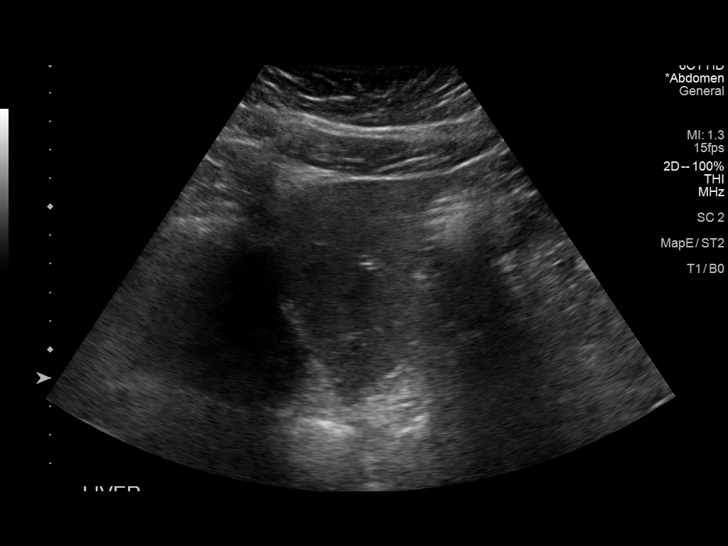
[im 30/30]
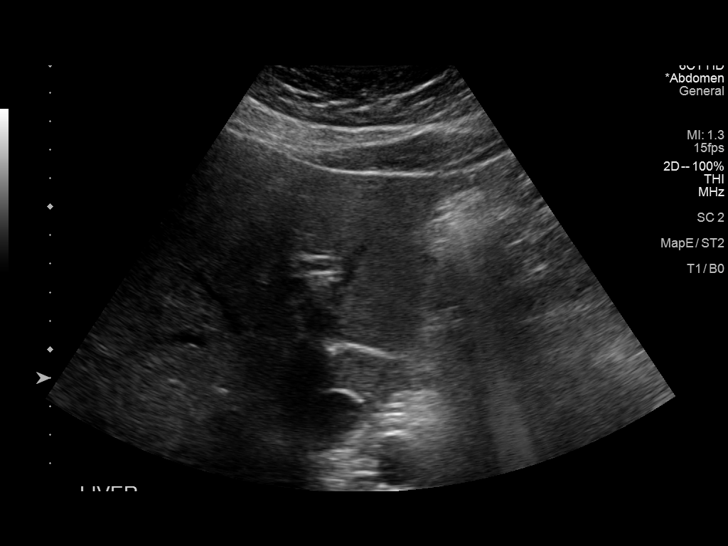

[14 of 25 positions shown; findings below may reference images not displayed]

FINDINGS: Gallbladder:

Surgically absent.

Common bile duct:

Diameter: 4 mm

Liver:

Heterogeneous, mildly increased parenchymal echogenicity diffusely
without focal lesion identified. Portal vein is patent on color
Doppler imaging with normal direction of blood flow towards the
liver.

Other: None.
IMPRESSION: 1. Mildly echogenic and heterogeneous liver parenchyma, nonspecific
though can be seen with steatosis, chronic hepatitis, and
infiltrative processes.
2. Status post cholecystectomy.  No biliary dilatation.

## 2022-05-11 DIAGNOSIS — J40 Bronchitis, not specified as acute or chronic: Secondary | ICD-10-CM | POA: Diagnosis not present

## 2022-07-15 ENCOUNTER — Other Ambulatory Visit: Payer: Self-pay | Admitting: Cardiovascular Disease

## 2022-08-20 DIAGNOSIS — J329 Chronic sinusitis, unspecified: Secondary | ICD-10-CM | POA: Diagnosis not present

## 2022-10-25 DIAGNOSIS — E039 Hypothyroidism, unspecified: Secondary | ICD-10-CM | POA: Diagnosis not present

## 2022-10-25 DIAGNOSIS — I10 Essential (primary) hypertension: Secondary | ICD-10-CM | POA: Diagnosis not present

## 2022-10-25 DIAGNOSIS — R7303 Prediabetes: Secondary | ICD-10-CM | POA: Diagnosis not present

## 2022-10-25 DIAGNOSIS — E782 Mixed hyperlipidemia: Secondary | ICD-10-CM | POA: Diagnosis not present

## 2022-10-25 DIAGNOSIS — K219 Gastro-esophageal reflux disease without esophagitis: Secondary | ICD-10-CM | POA: Diagnosis not present

## 2022-10-25 DIAGNOSIS — R400 Somnolence: Secondary | ICD-10-CM | POA: Diagnosis not present

## 2022-10-25 DIAGNOSIS — Z23 Encounter for immunization: Secondary | ICD-10-CM | POA: Diagnosis not present

## 2022-10-25 DIAGNOSIS — Z0000 Encounter for general adult medical examination without abnormal findings: Secondary | ICD-10-CM | POA: Diagnosis not present

## 2022-10-27 DIAGNOSIS — Z6834 Body mass index (BMI) 34.0-34.9, adult: Secondary | ICD-10-CM | POA: Diagnosis not present

## 2022-10-27 DIAGNOSIS — Z01419 Encounter for gynecological examination (general) (routine) without abnormal findings: Secondary | ICD-10-CM | POA: Diagnosis not present

## 2022-10-27 DIAGNOSIS — E063 Autoimmune thyroiditis: Secondary | ICD-10-CM | POA: Diagnosis not present

## 2022-10-27 DIAGNOSIS — Z1231 Encounter for screening mammogram for malignant neoplasm of breast: Secondary | ICD-10-CM | POA: Diagnosis not present

## 2022-10-29 DIAGNOSIS — E063 Autoimmune thyroiditis: Secondary | ICD-10-CM | POA: Diagnosis not present

## 2022-11-12 DIAGNOSIS — Z77120 Contact with and (suspected) exposure to mold (toxic): Secondary | ICD-10-CM | POA: Diagnosis not present

## 2022-11-12 DIAGNOSIS — J069 Acute upper respiratory infection, unspecified: Secondary | ICD-10-CM | POA: Diagnosis not present

## 2022-11-12 DIAGNOSIS — R0689 Other abnormalities of breathing: Secondary | ICD-10-CM | POA: Diagnosis not present

## 2023-01-11 DIAGNOSIS — J019 Acute sinusitis, unspecified: Secondary | ICD-10-CM | POA: Diagnosis not present

## 2023-03-22 DIAGNOSIS — L820 Inflamed seborrheic keratosis: Secondary | ICD-10-CM | POA: Diagnosis not present

## 2023-03-22 DIAGNOSIS — D485 Neoplasm of uncertain behavior of skin: Secondary | ICD-10-CM | POA: Diagnosis not present

## 2023-03-22 DIAGNOSIS — D0439 Carcinoma in situ of skin of other parts of face: Secondary | ICD-10-CM | POA: Diagnosis not present

## 2023-07-07 DIAGNOSIS — D0439 Carcinoma in situ of skin of other parts of face: Secondary | ICD-10-CM | POA: Diagnosis not present

## 2023-07-12 ENCOUNTER — Encounter: Payer: Self-pay | Admitting: Cardiovascular Disease

## 2023-08-14 NOTE — Progress Notes (Unsigned)
 Cardiology Clinic Note   Patient Name: Angie Manning Date of Encounter: 08/16/2023  Primary Care Provider:  Cleotilde Planas, MD Primary Cardiologist:  Jerel Balding, MD  Patient Profile    Angie Manning 58 year old female presents to the clinic today for follow-up evaluation of her chest pain and paroxysmal atrial fibrillation.  Past Medical History    Past Medical History:  Diagnosis Date   Family history of adverse reaction to anesthesia    mother--- ponv   Frequency of urination    GERD (gastroesophageal reflux disease)    History of atrial fibrillation    cardiologist-- dr croitoru (lov note in epic 11-28-2020)  normal ETT and echo in epic 05/ 2022;  PAF during pregnancy in 2005 w/ HTN/ PVCs/ PACs   History of COVID-19 05/2020   per pt moderate symptoms that resolved   History of kidney stones    History of melanoma excision 06/2000   per pt from leg, localized w/ no recurrence   Hypothyroidism due to Hashimoto's thyroiditis    endocrinologist--- dr althteiner;   chronic autoimmune , mild primary hypothyroidism   PONV (postoperative nausea and vomiting)    Pre-diabetes    Right ureteral calculus    Urgency of urination    Past Surgical History:  Procedure Laterality Date   CYSTOSCOPY WITH RETROGRADE PYELOGRAM, URETEROSCOPY AND STENT PLACEMENT Right 12/30/2020   Procedure: CYSTOSCOPY WITH RETROGRADE PYELOGRAM, URETEROSCOPY AND STENT PLACEMENT;  Surgeon: Elisabeth Valli BIRCH, MD;  Location: [facility];  Service: Urology;  Laterality: Right;   CYSTOSCOPY/URETEROSCOPY/HOLMIUM LASER/STENT PLACEMENT Right 08/18/2018   Procedure: CYSTOSCOPY/URETEROSCOPY/STENT PLACEMENT basket stone extraction;  Surgeon: Carolee Sherwood BIRCH DOUGLAS, MD;  Location: [facility];  Service: Urology;  Laterality: Right;   HOLMIUM LASER APPLICATION Right 03/15/2013   Procedure: HOLMIUM LASER APPLICATION;  Surgeon: Garnette Shack, MD;  Location: [facility];  Service: Urology;  Laterality: Right;   HOLMIUM LASER APPLICATION Right 12/30/2020   Procedure: HOLMIUM LASER APPLICATION;  Surgeon: Elisabeth Valli BIRCH, MD;  Location: [facility];  Service: Urology;  Laterality: Right;   LAPAROSCOPIC CHOLECYSTECTOMY  05-06-2000   URETEROSCOPY Right 03/15/2013   Procedure: URETEROSCOPY;  Surgeon: Garnette Shack, MD;  Location: [facility];  Service: Urology;  Laterality: Right;    Allergies  Allergies  Allergen Reactions   Shrimp [Shellfish Allergy] Hives    History of Present Illness    Angie Manning has a PMH of hypertension, GERD, nephrolithiasis, moderate obesity, and prediabetes.  She did have episodes of PVCs PACs and atrial fibrillation during pregnancy in 2005.  She was prescribed bedrest and medications.  Her issues resolved after delivery.   She was seen initially by cardiology for increasing palpitations and pain that was shooting in her left arm.  The pain appeared to be related to neuropathic pain that was radiating from her axillary region to the medial portion of her hand.  She reported that one of her fingers to turn black.  She was worried about possible thrombus.  She underwent treadmill stress testing which was normal.  She also had an echocardiogram that was normal with a borderline left atrial dilation.  Her renal Dopplers were negative for renal artery stenosis.   She presented to the clinic 05/26/21 for follow-up evaluation and stated she had an episode of chest discomfort that was nitro responsive.  It was in the setting of physical activity.  She was helping her daughter pack for college.  She took one of her husbands nitroglycerin  and her chest discomfort resolved within 5 minutes.  We reviewed her previous echocardiogram and stress testing.  She expressed understanding.  I ordered coronary CTA,  BMP, and planned  follow-up after the testing.  She was lost to follow-up.  Her coronary CTA  showed a coronary calcium score of 0 on 06/11/2021.  She presents to the clinic today for follow-up evaluation and states she is noticing more frequent palpitations.  She reports that she did do better with her hydration.  Her symptoms began in May/June.  She also describes chest discomfort that is nonexertional.  This is random and brief.  We reviewed her previous coronary calcium scoring.  She expressed understanding.  Her EKG today shows sinus tachycardia.  Her blood pressure initially was 142/90 and on recheck it is 132/78.  I reviewed options for treatment.  We used shared decision making to decide to start Toprol  succinate 12.5 mg daily, order thyroid  panel, BMP and CBC.  I will plan follow-up in 1-2 months.    Today she denies chest pain, shortness of breath, lower extremity edema, fatigue, palpitations, melena, hematuria, hemoptysis, diaphoresis, weakness, presyncope, syncope, orthopnea, and PND.     Home Medications    Prior to Admission medications   Medication Sig Start Date End Date Taking? Authorizing Provider  diphenhydrAMINE  (BENADRYL ) 50 MG tablet Take 1 hour prior to test 05/26/21   Emelia Josefa HERO, NP  losartan  (COZAAR ) 25 MG tablet Take 1 tablet (25 mg total) by mouth daily. PATIENT MUST SCHEDULE APPOINTMENT FOR FUTURE REFILLS FIRST ATTEMPT 07/15/22 04/11/23  Croitoru, Jerel, MD  metoprolol  tartrate (LOPRESSOR ) 100 MG tablet Take 1 tablet (100 mg total) by mouth once for 1 dose. PLEASE TAKE METOPROLOL  2  HOURS PRIOR TO CTA SCAN. 05/28/21 05/28/21  Emelia Josefa HERO, NP  predniSONE  (DELTASONE ) 50 MG tablet Take 1 tablet 13 hours prior to test. Take another tablet 7 hours prior to test. Take another 1 hour prior to test 05/26/21   Emelia Josefa HERO, NP  RABEprazole (ACIPHEX) 20 MG tablet Take 20 mg by mouth 2 (two) times daily as needed (for acid reflux).    [provider]  TIROSINT 125 MCG CAPS Take 1 capsule by mouth every morning. 10/14/20   [provider]    Family  History    Family History  Problem Relation Age of Onset   Breast cancer Mother    Diabetes Father    Heart failure Father    She indicated that her mother is alive. She indicated that her father is alive.  Social History    Social History   Socioeconomic History   Marital status: Married    Spouse name: Not on file   Number of children: 3   Years of education: Not on file   Highest education level: Not on file  Occupational History   Not on file  Tobacco Use   Smoking status: Never   Smokeless tobacco: Never  Vaping Use   Vaping status: Never Used  Substance and Sexual Activity   Alcohol use: No   Drug use: Never   Sexual activity: Yes    Birth control/protection: Post-menopausal  Other Topics Concern   Not on file  Social History Narrative   Not on file   Social Drivers of Health   Financial Resource Strain: Not on file  Food Insecurity: Not on file  Transportation Needs: Not on file  Physical Activity: Not on file  Stress: Not  on file  Social Connections: Not on file  Intimate Partner Violence: Not on file     Review of Systems    General:  No chills, fever, night sweats or weight changes.  Cardiovascular:  No chest pain, dyspnea on exertion, edema, orthopnea, palpitations, paroxysmal nocturnal dyspnea. Dermatological: No rash, lesions/masses Respiratory: No cough, dyspnea Urologic: No hematuria, dysuria Abdominal:   No nausea, vomiting, diarrhea, bright red blood per rectum, melena, or hematemesis Neurologic:  No visual changes, wkns, changes in mental status. All other systems reviewed and are otherwise negative except as noted above.  Physical Exam    VS:  BP 132/78   Pulse (!) 101   Ht 5' 4 (1.626 m)   Wt 188 lb (85.3 kg)   LMP 11/01/2018 (Exact Date)   SpO2 95%   BMI 32.27 kg/m  , BMI Body mass index is 32.27 kg/m. GEN: Well nourished, well developed, in no acute distress. HEENT: normal. Neck: Supple, no JVD, carotid bruits, or  masses. Cardiac: RRR, no murmurs, rubs, or gallops. No clubbing, cyanosis, edema.  Radials/DP/PT 2+ and equal bilaterally.  Respiratory:  Respirations regular and unlabored, clear to auscultation bilaterally. GI: Soft, nontender, nondistended, BS + x 4. MS: no deformity or atrophy. Skin: warm and dry, no rash. Neuro:  Strength and sensation are intact. Psych: Normal affect.  Accessory Clinical Findings    Recent Labs: No results found for requested labs within last 365 days.   Recent Lipid Panel No results found for: CHOL, TRIG, HDL, CHOLHDL, VLDL, LDLCALC, LDLDIRECT       ECG personally reviewed by me today- EKG Interpretation Date/Time:  Tuesday August 16 2023 11:06:55 EDT Ventricular Rate:  101 PR Interval:  152 QRS Duration:  82 QT Interval:  352 QTC Calculation: 456 R Axis:   -35  Text Interpretation: Sinus tachycardia Possible Left atrial enlargement Left axis deviation Nonspecific ST abnormality Confirmed by Emelia Hazy [phone]) on 08/16/2023 11:11:17 AM   EKG 05/26/2021 normal sinus rhythm left axis deviation 81 bpm- No acute changes   Echocardiogram 06/06/2020   IMPRESSIONS     1. Left ventricular ejection fraction, by estimation, is 65 to 70%. Left  ventricular ejection fraction by 3D volume is 70 %. The left ventricle has  normal function. The left ventricle has no regional wall motion  abnormalities. Indeterminate diastolic  filling due to E-A fusion.   2. Right ventricular systolic function is normal. The right ventricular  size is normal. There is normal pulmonary artery systolic pressure.   3. The mitral valve is grossly normal. No evidence of mitral valve  regurgitation.   4. The aortic valve is normal in structure. Aortic valve regurgitation is  not visualized. No aortic stenosis is present.   5. The inferior vena cava is normal in size with greater than 50%  respiratory variability, suggesting right atrial pressure of 3 mmHg.    Comparison(s): No prior Echocardiogram.    Exercise stress test 05/20/2020   Blood pressure demonstrated a normal response to exercise. There was no ST segment deviation noted during stress.   Normal ETT Normal hemodynamic response No significant arrhythmias  Coronary CTA 06/11/2021  FINDINGS: A 120 kV prospective scan was triggered in the descending thoracic aorta at 111 HU's. Axial non-contrast 3 mm slices were carried out through the heart. The data set was analyzed on a dedicated work station and scored using the Agatson method. Gantry rotation speed was 250 msecs and collimation was .6 mm. No beta blockade and 0.8  mg of sl NTG was given. The 3D data set was reconstructed in 5% intervals of the 67-82 % of the R-R cycle. Diastolic phases were analyzed on a dedicated work station using MPR, MIP and VRT modes. The patient received 80 cc of contrast.   Aorta: Normal size.  2.3 cm.  No calcifications.  No dissection.   Aortic Valve:  Trileaflet.  No calcifications.   Coronary Arteries:  Normal coronary origin.  Right dominance.   RCA is a large dominant artery that gives rise to PDA and PLVB. There is no plaque.   Left main is a large artery that gives rise to LAD and LCX arteries.   LAD is a large vessel that has no plaque.   LCX is a non-dominant artery that gives rise to one large OM1 branch. There is no plaque.   Coronary Calcium Score: 0   Other findings:   Normal pulmonary vein drainage into the left atrium.   Normal let atrial appendage without a thrombus.   Normal size of the pulmonary artery.   IMPRESSION: 1. Coronary calcium score of 0. This was 0 percentile for age-, race-, and sex-matched controls.   2. Normal coronary origin with right dominance.   3. No evidence of CAD.  CAD RADS 0.   4.  Consider noncardiac causes of chest pain.   Annabella Scarce, MD     Electronically Signed   By: Annabella Scarce M.D.   On: 06/11/2021  14:13     Assessment & Plan   1.  Paroxysmal atrial fibrillation-EKG today shows sinus tach 1 BP.  Denies symptoms of atrial fibrillation.  Denies accelerated or irregular heartbeat.  Previously noted to have PVCs, PACs and atrial fibrillation during pregnancy in 2005.  Her issues resolved after delivery. Offered monitor , defer at this time . Heart healthy low-sodium diet-reviewed Maintain physical activity Avoid triggers caffeine, chocolate, EtOH, dehydration etc. Increase p.o. hydration Thyriod panel,BMP, CBC Start metoprolol  succinate 12.5 mg daily  Chest pain-denies further episodes of chest discomfort.  Denies exertional chest pain.  Coronary CTA 06/11/2021 showed a coronary calcium score of 0. No plans for ischemic evaluation Continue metoprolol   Essential hypertension-BP today 142/90 Start  metoprolol  succinate 12.5 mg daily Heart healthy low-sodium diet-salty 6 given Increase physical activity as tolerated   Prediabetes-glucose 97 on 05/26/2021. Heart healthy low-sodium carb modified diet Maintain physical activity  Disposition: Follow-up with Dr. Francyne or me in 1-2 months.   Josefa HERO. Emaan Gary NP-C     08/16/2023, 11:38 AM [facility] [address] Office [phone] Fax [phone]    I spent 14 minutes examining this patient, reviewing medications, and using patient centered shared decision making involving their cardiac care.   I spent  20 minutes reviewing past medical history,  medications, and prior cardiac tests.

## 2023-08-16 ENCOUNTER — Encounter: Payer: Self-pay | Admitting: General Practice

## 2023-08-16 ENCOUNTER — Ambulatory Visit: Attending: General Practice | Admitting: General Practice

## 2023-08-16 VITALS — BP 132/78 | HR 101 | Ht 64.0 in | Wt 188.0 lb

## 2023-08-16 DIAGNOSIS — R7303 Prediabetes: Secondary | ICD-10-CM

## 2023-08-16 DIAGNOSIS — R0789 Other chest pain: Secondary | ICD-10-CM

## 2023-08-16 DIAGNOSIS — I480 Paroxysmal atrial fibrillation: Secondary | ICD-10-CM | POA: Diagnosis not present

## 2023-08-16 DIAGNOSIS — I10 Essential (primary) hypertension: Secondary | ICD-10-CM | POA: Diagnosis not present

## 2023-08-16 MED ORDER — METOPROLOL SUCCINATE ER 25 MG PO TB24: 12.5000 mg | ORAL_TABLET | Freq: Every day | ORAL | 3 refills | Status: DC

## 2023-08-16 NOTE — Patient Instructions (Addendum)
 Medication Instructions:  Start Metoprolol  succinate 12.5 mg once a day  *If you need a refill on your cardiac medications before your next appointment, please call your pharmacy*  Lab Work: Today we are going to draw a CBC, BMP, and Thyroid  panel If you have labs (blood work) drawn today and your tests are completely normal, you will receive your results only by: MyChart Message (if you have MyChart) OR A paper copy in the mail If you have any lab test that is abnormal or we need to change your treatment, we will call you to review the results.  Testing/Procedures: No testing  Follow-Up: At [facility], you and your health needs are our priority.  As part of our continuing mission to provide you with exceptional heart care, our providers are all part of one team.  This team includes your primary Cardiologist (physician) and Advanced Practice Providers or APPs (Physician Assistants and Nurse Practitioners) who all work together to provide you with the care you need, when you need it.  Your next appointment:   1-2 month(s)  Josefa Beauvais NP  We recommend signing up for the patient portal called MyChart.  Sign up information is provided on this After Visit Summary.  MyChart is used to connect with patients for Virtual Visits (Telemedicine).  Patients are able to view lab/test results, encounter notes, upcoming appointments, etc.  Non-urgent messages can be sent to your provider as well.   To learn more about what you can do with MyChart, go to [email or web address].   Other Instructions Increase your hydration level, avoid Caffeine & Alcohol

## 2023-08-17 ENCOUNTER — Ambulatory Visit: Payer: Self-pay | Admitting: General Practice

## 2023-08-17 LAB — BASIC METABOLIC PANEL WITH GFR
BUN/Creatinine Ratio: 12 (ref 9–23)
BUN: 9 mg/dL (ref 6–24)
CO2: 17 mmol/L — ABNORMAL LOW (ref 20–29)
Calcium: 9.7 mg/dL (ref 8.7–10.2)
Chloride: 106 mmol/L (ref 96–106)
Creatinine, Ser: 0.74 mg/dL (ref 0.57–1.00)
Glucose: 102 mg/dL — ABNORMAL HIGH (ref 70–99)
Potassium: 3.9 mmol/L (ref 3.5–5.2)
Sodium: 144 mmol/L (ref 134–144)
eGFR: 95 mL/min/1.73 (ref >59)

## 2023-08-17 LAB — THYROID PANEL WITH TSH
Free Thyroxine Index: 3.7 (ref 1.2–4.9)
T3 Uptake Ratio: 30 % (ref 24–39)
T4, Total: 12.4 ug/dL — ABNORMAL HIGH (ref 4.5–12.0)
TSH: 0.715 u[IU]/mL (ref 0.450–4.500)

## 2023-08-17 LAB — CBC
Hematocrit: 44.2 % (ref 34.0–46.6)
Hemoglobin: 14.3 g/dL (ref 11.1–15.9)
MCH: 28.8 pg (ref 26.6–33.0)
MCHC: 32.4 g/dL (ref 31.5–35.7)
MCV: 89 fL (ref 79–97)
Platelets: 277 x10E3/uL (ref 150–450)
RBC: 4.97 x10E6/uL (ref 3.77–5.28)
RDW: 12.8 % (ref 11.7–15.4)
WBC: 4.9 x10E3/uL (ref 3.4–10.8)

## 2023-10-19 ENCOUNTER — Ambulatory Visit: Admitting: General Practice

## 2023-10-27 DIAGNOSIS — E063 Autoimmune thyroiditis: Secondary | ICD-10-CM | POA: Diagnosis not present

## 2023-10-28 DIAGNOSIS — E063 Autoimmune thyroiditis: Secondary | ICD-10-CM | POA: Diagnosis not present

## 2023-11-01 DIAGNOSIS — Z01419 Encounter for gynecological examination (general) (routine) without abnormal findings: Secondary | ICD-10-CM | POA: Diagnosis not present

## 2023-11-01 DIAGNOSIS — Z1231 Encounter for screening mammogram for malignant neoplasm of breast: Secondary | ICD-10-CM | POA: Diagnosis not present

## 2023-11-01 DIAGNOSIS — Z6832 Body mass index (BMI) 32.0-32.9, adult: Secondary | ICD-10-CM | POA: Diagnosis not present

## 2023-11-01 DIAGNOSIS — Z23 Encounter for immunization: Secondary | ICD-10-CM | POA: Diagnosis not present

## 2023-11-20 NOTE — Progress Notes (Unsigned)
 Cardiology Clinic Note   Patient Name: [name] Date of Encounter: 11/22/2023  Primary Care Provider:  Cleotilde Planas, MD Primary Cardiologist:  Jerel Balding, MD  Patient Profile    NYCHELLE Manning 58 year old female presents to the clinic today for follow-up evaluation of her chest pain and paroxysmal atrial fibrillation.  Past Medical History    Past Medical History:  Diagnosis Date   Family history of adverse reaction to anesthesia    mother--- ponv   Frequency of urination    GERD (gastroesophageal reflux disease)    History of atrial fibrillation    cardiologist-- dr croitoru (lov note in epic 11-28-2020)  normal ETT and echo in epic 05/ 2022;  PAF during pregnancy in 2005 w/ HTN/ PVCs/ PACs   History of COVID-19 05/2020   per pt moderate symptoms that resolved   History of kidney stones    History of melanoma excision 06/2000   per pt from leg, localized w/ no recurrence   Hypothyroidism due to Hashimoto's thyroiditis    endocrinologist--- dr althteiner;   chronic autoimmune , mild primary hypothyroidism   PONV (postoperative nausea and vomiting)    Pre-diabetes    Right ureteral calculus    Urgency of urination    Past Surgical History:  Procedure Laterality Date   CYSTOSCOPY WITH RETROGRADE PYELOGRAM, URETEROSCOPY AND STENT PLACEMENT Right 12/30/2020   Procedure: CYSTOSCOPY WITH RETROGRADE PYELOGRAM, URETEROSCOPY AND STENT PLACEMENT;  Surgeon: Elisabeth Valli BIRCH, MD;  Location: [facility] ;  Service: Urology;  Laterality: Right;   CYSTOSCOPY/URETEROSCOPY/HOLMIUM LASER/STENT PLACEMENT Right 08/18/2018   Procedure: CYSTOSCOPY/URETEROSCOPY/STENT PLACEMENT basket stone extraction;  Surgeon: Carolee Sherwood BIRCH DOUGLAS, MD;  Location: [facility];  Service: Urology;  Laterality: Right;   HOLMIUM LASER APPLICATION Right 03/15/2013   Procedure: HOLMIUM LASER APPLICATION;  Surgeon: Garnette Shack, MD;  Location: [facility];  Service: Urology;  Laterality: Right;   HOLMIUM LASER APPLICATION Right 12/30/2020   Procedure: HOLMIUM LASER APPLICATION;  Surgeon: Elisabeth Valli BIRCH, MD;  Location: [facility];  Service: Urology;  Laterality: Right;   LAPAROSCOPIC CHOLECYSTECTOMY  05-06-2000   URETEROSCOPY Right 03/15/2013   Procedure: URETEROSCOPY;  Surgeon: Garnette Shack, MD;  Location: [facility];  Service: Urology;  Laterality: Right;    Allergies  Allergies  Allergen Reactions   Shrimp [Shellfish Allergy] Hives    History of Present Illness    Angie Manning has a PMH of hypertension, GERD, nephrolithiasis, moderate obesity, and prediabetes.  She did have episodes of PVCs PACs and atrial fibrillation during pregnancy in 2005.  She was prescribed bedrest and medications.  Her issues resolved after delivery.   She was seen initially by cardiology for increasing palpitations and pain that was shooting in her left arm.  The pain appeared to be related to neuropathic pain that was radiating from her axillary region to the medial portion of her hand.  She reported that one of her fingers to turn black.  She was worried about possible thrombus.  She underwent treadmill stress testing which was normal.  She also had an echocardiogram that was normal with a borderline left atrial dilation.  Her renal Dopplers were negative for renal artery stenosis.   She presented to the clinic 05/26/21 for follow-up evaluation and stated she had an episode of chest discomfort that was nitro responsive.  It was in the setting of physical activity.  She was helping her daughter pack for college.  She took one of her husbands nitroglycerin  and her chest discomfort resolved within 5 minutes.  We reviewed her previous echocardiogram and stress testing.  She expressed understanding.  I ordered coronary CTA,  BMP, and planned  follow-up after the testing.  She was lost to follow-up.  Her coronary CTA  showed a coronary calcium score of 0 on 06/11/2021.  She presented to the clinic 08/16/23 for follow-up evaluation and stated she was noticing more frequent palpitations.  She reported that she was doing better with her hydration.  Her symptoms began in May/June.  She also described chest discomfort that was nonexertional.  Episodes were random and brief.  We reviewed her previous coronary calcium scoring.  She expressed understanding.  Her EKG showed sinus tachycardia.  Her blood pressure initially was 142/90 and on recheck was 132/78.  I reviewed options for treatment.  We used shared decision making to decide to start Toprol  succinate 12.5 mg daily, order thyroid  panel, BMP and CBC.  I  planned follow-up in 1-2 months.  She presents to the clinic today for follow-up evaluation and states she is feeling much better since being on metoprolol .  She has been taking it every other day.  She also followed up with her endocrinologist who rechecked her thyroid .  She had similar values with her October 9 blood draw.  She notes that she has been taking the metoprolol  every other day.  Her pulse today is 85.  Her blood pressure initially is 130/92 and on recheck is well-controlled.   Today she denies chest pain, shortness of breath, lower extremity edema, fatigue, palpitations, melena, hematuria, hemoptysis, diaphoresis, weakness, presyncope, syncope, orthopnea, and PND.     Home Medications    Prior to Admission medications   Medication Sig Start Date End Date Taking? Authorizing Provider  diphenhydrAMINE  (BENADRYL ) 50 MG tablet Take 1 hour prior to test 05/26/21   Emelia Josefa HERO, NP  losartan  (COZAAR ) 25 MG tablet Take 1 tablet (25 mg total) by mouth daily. PATIENT MUST SCHEDULE APPOINTMENT FOR FUTURE REFILLS FIRST ATTEMPT 07/15/22 04/11/23  Croitoru, Jerel, MD  metoprolol  tartrate (LOPRESSOR ) 100 MG tablet Take 1 tablet (100 mg total) by mouth once for 1 dose. PLEASE TAKE METOPROLOL  2  HOURS PRIOR TO CTA  SCAN. 05/28/21 05/28/21  Emelia Josefa HERO, NP  predniSONE  (DELTASONE ) 50 MG tablet Take 1 tablet 13 hours prior to test. Take another tablet 7 hours prior to test. Take another 1 hour prior to test 05/26/21   Emelia Josefa HERO, NP  RABEprazole (ACIPHEX) 20 MG tablet Take 20 mg by mouth 2 (two) times daily as needed (for acid reflux).    [provider]  TIROSINT 125 MCG CAPS Take 1 capsule by mouth every morning. 10/14/20   [provider]    Family History    Family History  Problem Relation Age of Onset   Breast cancer Mother    Diabetes Father    Heart failure Father    She indicated that her mother is alive. She indicated that her father is alive.  Social History    Social History   Socioeconomic History   Marital status: Married    Spouse name: Not on file   Number of children: 3   Years of education: Not on file   Highest education level: Not on file  Occupational History   Not on file  Tobacco Use   Smoking status: Never   Smokeless tobacco: Never  Vaping Use   Vaping status:  Never Used  Substance and Sexual Activity   Alcohol use: No   Drug use: Never   Sexual activity: Yes    Birth control/protection: Post-menopausal  Other Topics Concern   Not on file  Social History Narrative   Not on file   Social Drivers of Health   Financial Resource Strain: Not on file  Food Insecurity: Not on file  Transportation Needs: Not on file  Physical Activity: Not on file  Stress: Not on file  Social Connections: Not on file  Intimate Partner Violence: Not on file     Review of Systems    General:  No chills, fever, night sweats or weight changes.  Cardiovascular:  No chest pain, dyspnea on exertion, edema, orthopnea, palpitations, paroxysmal nocturnal dyspnea. Dermatological: No rash, lesions/masses Respiratory: No cough, dyspnea Urologic: No hematuria, dysuria Abdominal:   No nausea, vomiting, diarrhea, bright red blood per rectum, melena, or  hematemesis Neurologic:  No visual changes, wkns, changes in mental status. All other systems reviewed and are otherwise negative except as noted above.  Physical Exam    VS:  BP 128/86   Pulse 85   Ht 5' 4 (1.626 m)   Wt 190 lb (86.2 kg)   LMP 11/01/2018 (Exact Date)   SpO2 96%   BMI 32.61 kg/m  , BMI Body mass index is 32.61 kg/m. GEN: Well nourished, well developed, in no acute distress. HEENT: normal. Neck: Supple, no JVD, carotid bruits, or masses. Cardiac: RRR, no murmurs, rubs, or gallops. No clubbing, cyanosis, edema.  Radials/DP/PT 2+ and equal bilaterally.  Respiratory:  Respirations regular and unlabored, clear to auscultation bilaterally. GI: Soft, nontender, nondistended, BS + x 4. MS: no deformity or atrophy. Skin: warm and dry, no rash. Neuro:  Strength and sensation are intact. Psych: Normal affect.  Accessory Clinical Findings    Recent Labs: 08/16/2023: BUN 9; Creatinine, Ser 0.74; Hemoglobin 14.3; Platelets 277; Potassium 3.9; Sodium 144; TSH 0.715   Recent Lipid Panel No results found for: CHOL, TRIG, HDL, CHOLHDL, VLDL, LDLCALC, LDLDIRECT       ECG personally reviewed by me today-     EKG 05/26/2021 normal sinus rhythm left axis deviation 81 bpm- No acute changes   Echocardiogram 06/06/2020   IMPRESSIONS     1. Left ventricular ejection fraction, by estimation, is 65 to 70%. Left  ventricular ejection fraction by 3D volume is 70 %. The left ventricle has  normal function. The left ventricle has no regional wall motion  abnormalities. Indeterminate diastolic  filling due to E-A fusion.   2. Right ventricular systolic function is normal. The right ventricular  size is normal. There is normal pulmonary artery systolic pressure.   3. The mitral valve is grossly normal. No evidence of mitral valve  regurgitation.   4. The aortic valve is normal in structure. Aortic valve regurgitation is  not visualized. No aortic stenosis is  present.   5. The inferior vena cava is normal in size with greater than 50%  respiratory variability, suggesting right atrial pressure of 3 mmHg.   Comparison(s): No prior Echocardiogram.    Exercise stress test 05/20/2020   Blood pressure demonstrated a normal response to exercise. There was no ST segment deviation noted during stress.   Normal ETT Normal hemodynamic response No significant arrhythmias  Coronary CTA 06/11/2021  FINDINGS: A 120 kV prospective scan was triggered in the descending thoracic aorta at 111 HU's. Axial non-contrast 3 mm slices were carried out through the heart. The data  set was analyzed on a dedicated work station and scored using the [employer] . Gantry rotation speed was 250 msecs and collimation was .6 mm. No beta blockade and 0.8 mg of sl NTG was given. The 3D data set was reconstructed in 5% intervals of the 67-82 % of the R-R cycle. Diastolic phases were analyzed on a dedicated work station using MPR, MIP and VRT modes. The patient received 80 cc of contrast.   Aorta: Normal size.  2.3 cm.  No calcifications.  No dissection.   Aortic Valve:  Trileaflet.  No calcifications.   Coronary Arteries:  Normal coronary origin.  Right dominance.   RCA is a large dominant artery that gives rise to PDA and PLVB. There is no plaque.   Left main is a large artery that gives rise to LAD and LCX arteries.   LAD is a large vessel that has no plaque.   LCX is a non-dominant artery that gives rise to one large OM1 branch. There is no plaque.   Coronary Calcium Score: 0   Other findings:   Normal pulmonary vein drainage into the left atrium.   Normal let atrial appendage without a thrombus.   Normal size of the pulmonary artery.   IMPRESSION: 1. Coronary calcium score of 0. This was 0 percentile for age-, race-, and sex-matched controls.   2. Normal coronary origin with right dominance.   3. No evidence of CAD.  CAD RADS 0.   4.  Consider  noncardiac causes of chest pain.   Annabella Scarce, MD     Electronically Signed   By: Annabella Scarce M.D.   On: 06/11/2021 14:13     Assessment & Plan   1.  Paroxysmal atrial fibrillation-heart rate today 85.  Denies palpitations or accelerated heartbeat.  She was previously noted to have PVCs, PACs and atrial fibrillation during pregnancy in 2005.  Her issues resolved after delivery.  Tolerating metoprolol  well. Heart healthy low-sodium diet-reviewed Maintain physical activity Avoid triggers caffeine, chocolate, EtOH, dehydration etc. Increase p.o. hydration Thyriod panel,BMP, CBC-labs reassuring 08/16/2023-reviewed recent labs from endocrinology taken on 10/27/2023.  Lab values very similar. Continue metoprolol  succinate-patient taking every other day.  Essential hypertension-BP today 128/86 Start  metoprolol  succinate  Heart healthy low-sodium diet-salty 6 given Increase physical activity as tolerated  Chest pain-no chest pain today.  Denies exertional chest discomfort.  Coronary CTA 06/11/2021 showed a coronary calcium score of 0. No plans for ischemic evaluation Continue metoprolol  Heart healthy low-sodium diet   Prediabetes-glucose 102 on 08/16/23 Heart healthy low-sodium carb modified diet Maintain physical activity  Disposition: Follow-up with Dr. Francyne or me in 9-12 months.   Josefa HERO. Zykira Matlack NP-C     11/22/2023, 10:12 AM [facility] [address] Office [phone] Fax [phone]    I spent 14 minutes examining this patient, reviewing medications, and using patient centered shared decision making involving their cardiac care.   I spent  20 minutes reviewing past medical history,  medications, and prior cardiac tests.

## 2023-11-22 ENCOUNTER — Encounter: Payer: Self-pay | Admitting: General Practice

## 2023-11-22 ENCOUNTER — Ambulatory Visit: Attending: General Practice | Admitting: General Practice

## 2023-11-22 VITALS — BP 128/86 | HR 85 | Ht 64.0 in | Wt 190.0 lb

## 2023-11-22 DIAGNOSIS — R0789 Other chest pain: Secondary | ICD-10-CM

## 2023-11-22 DIAGNOSIS — I480 Paroxysmal atrial fibrillation: Secondary | ICD-10-CM

## 2023-11-22 DIAGNOSIS — R7303 Prediabetes: Secondary | ICD-10-CM | POA: Diagnosis not present

## 2023-11-22 DIAGNOSIS — I10 Essential (primary) hypertension: Secondary | ICD-10-CM | POA: Diagnosis not present

## 2023-11-22 MED ORDER — METOPROLOL SUCCINATE ER 25 MG PO TB24: 12.5000 mg | ORAL_TABLET | ORAL | Status: AC

## 2023-11-22 NOTE — Patient Instructions (Signed)
 Medication Instructions:  OK to CHANGE Metoprolol  to half tablet every other day  *If you need a refill on your cardiac medications before your next appointment, please call your pharmacy*  Lab Work: None ordered If you have labs (blood work) drawn today and your tests are completely normal, you will receive your results only by: MyChart Message (if you have MyChart) OR A paper copy in the mail If you have any lab test that is abnormal or we need to change your treatment, we will call you to review the results.  Testing/Procedures: None ordered  Follow-Up: At [facility], you and your health needs are our priority.  As part of our continuing mission to provide you with exceptional heart care, our providers are all part of one team.  This team includes your primary Cardiologist (physician) and Advanced Practice Providers or APPs (Physician Assistants and Nurse Practitioners) who all work together to provide you with the care you need, when you need it.  Your next appointment:   9-12 month(s)  Provider:   Jerel Balding, MD    We recommend signing up for the patient portal called MyChart.  Sign up information is provided on this After Visit Summary.  MyChart is used to connect with patients for Virtual Visits (Telemedicine).  Patients are able to view lab/test results, encounter notes, upcoming appointments, etc.  Non-urgent messages can be sent to your provider as well.   To learn more about what you can do with MyChart, go to [email or web address].   Other Instructions

## 2023-11-23 DIAGNOSIS — N3020 Other chronic cystitis without hematuria: Secondary | ICD-10-CM | POA: Diagnosis not present

## 2023-11-23 DIAGNOSIS — N200 Calculus of kidney: Secondary | ICD-10-CM | POA: Diagnosis not present

## 2023-11-23 DIAGNOSIS — N393 Stress incontinence (female) (male): Secondary | ICD-10-CM | POA: Diagnosis not present

## 2023-11-24 DIAGNOSIS — Z1321 Encounter for screening for nutritional disorder: Secondary | ICD-10-CM | POA: Diagnosis not present

## 2023-11-24 DIAGNOSIS — Z1329 Encounter for screening for other suspected endocrine disorder: Secondary | ICD-10-CM | POA: Diagnosis not present

## 2023-11-24 DIAGNOSIS — Z13228 Encounter for screening for other metabolic disorders: Secondary | ICD-10-CM | POA: Diagnosis not present

## 2023-11-24 DIAGNOSIS — Z131 Encounter for screening for diabetes mellitus: Secondary | ICD-10-CM | POA: Diagnosis not present

## 2023-11-24 DIAGNOSIS — Z13220 Encounter for screening for lipoid disorders: Secondary | ICD-10-CM | POA: Diagnosis not present

## 2023-12-06 ENCOUNTER — Other Ambulatory Visit: Payer: Self-pay | Admitting: Obstetrics and Gynecology

## 2023-12-06 DIAGNOSIS — Z803 Family history of malignant neoplasm of breast: Secondary | ICD-10-CM

## 2023-12-27 DIAGNOSIS — R1012 Left upper quadrant pain: Secondary | ICD-10-CM | POA: Diagnosis not present

## 2023-12-29 ENCOUNTER — Other Ambulatory Visit: Payer: Self-pay | Admitting: Student

## 2023-12-29 ENCOUNTER — Encounter: Payer: Self-pay | Admitting: Student

## 2023-12-29 DIAGNOSIS — R10A2 Flank pain, left side: Secondary | ICD-10-CM

## 2024-01-02 ENCOUNTER — Ambulatory Visit
Admission: RE | Admit: 2024-01-02 | Discharge: 2024-01-02 | Disposition: A | Discharge status: Home / Self Care | Source: Ambulatory Visit | Attending: Student | Admitting: Student

## 2024-01-02 DIAGNOSIS — N201 Calculus of ureter: Secondary | ICD-10-CM | POA: Diagnosis not present

## 2024-01-02 DIAGNOSIS — R10A2 Flank pain, left side: Secondary | ICD-10-CM

## 2024-01-02 DIAGNOSIS — N200 Calculus of kidney: Secondary | ICD-10-CM | POA: Diagnosis not present

## 2024-01-02 MED ORDER — IOPAMIDOL (ISOVUE-300) INJECTION 61%
100.0000 mL | Freq: Once | INTRAVENOUS | Status: AC | PRN
  Administered 2024-01-02: 11:00:00 100 mL via INTRAVENOUS

## 2024-01-04 ENCOUNTER — Other Ambulatory Visit

## 2024-01-05 ENCOUNTER — Encounter (HOSPITAL_COMMUNITY): Payer: Self-pay | Admitting: Urology

## 2024-01-05 ENCOUNTER — Other Ambulatory Visit: Payer: Self-pay | Admitting: Urology

## 2024-01-05 DIAGNOSIS — N201 Calculus of ureter: Secondary | ICD-10-CM | POA: Diagnosis not present

## 2024-01-05 NOTE — Progress Notes (Signed)
 Attempted to obtain medical history for pre op call via telephone, unable to reach at this time. HIPAA compliant voicemail message left requesting return call to pre surgical testing department.

## 2024-01-06 ENCOUNTER — Other Ambulatory Visit: Payer: Self-pay

## 2024-01-06 ENCOUNTER — Ambulatory Visit (HOSPITAL_COMMUNITY)
Admission: RE | Admit: 2024-01-06 | Discharge: 2024-01-06 | Disposition: A | Discharge status: Home / Self Care | Attending: Urology | Admitting: Urology

## 2024-01-06 ENCOUNTER — Ambulatory Visit (HOSPITAL_COMMUNITY)

## 2024-01-06 ENCOUNTER — Encounter (HOSPITAL_COMMUNITY): Payer: Self-pay | Admitting: Urology

## 2024-01-06 ENCOUNTER — Encounter (HOSPITAL_COMMUNITY): Admission: RE | Disposition: A | Payer: Self-pay | Source: Home / Self Care | Attending: Urology

## 2024-01-06 DIAGNOSIS — N201 Calculus of ureter: Secondary | ICD-10-CM | POA: Diagnosis not present

## 2024-01-06 HISTORY — PX: EXTRACORPOREAL SHOCK WAVE LITHOTRIPSY: SHX1557

## 2024-01-06 SURGERY — LITHOTRIPSY, ESWL
Anesthesia: LOCAL | Laterality: Left

## 2024-01-06 MED ORDER — DIAZEPAM 5 MG PO TABS
10.0000 mg | ORAL_TABLET | ORAL | Status: AC
  Administered 2024-01-06: 10 mg via ORAL
  Filled 2024-01-06: qty 2

## 2024-01-06 MED ORDER — SODIUM CHLORIDE 0.9 % IV SOLN: INTRAVENOUS | Status: DC

## 2024-01-06 MED ORDER — DIPHENHYDRAMINE HCL 25 MG PO CAPS
25.0000 mg | ORAL_CAPSULE | ORAL | Status: AC
  Administered 2024-01-06: 25 mg via ORAL
  Filled 2024-01-06: qty 1

## 2024-01-06 MED ORDER — TAMSULOSIN HCL 0.4 MG PO CAPS: 0.4000 mg | ORAL_CAPSULE | Freq: Every day | ORAL | 0 refills | Status: AC

## 2024-01-06 MED ORDER — CIPROFLOXACIN HCL 500 MG PO TABS
500.0000 mg | ORAL_TABLET | ORAL | Status: AC
  Administered 2024-01-06: 500 mg via ORAL
  Filled 2024-01-06: qty 1

## 2024-01-06 NOTE — Interval H&P Note (Signed)
 History and Physical Interval Note:  01/06/2024 10:52 AM  Loletta E Cranor-Cheek  has presented today for surgery, with the diagnosis of LEFT URETERAL STONE.  The various methods of treatment have been discussed with the patient and family. After consideration of risks, benefits and other options for treatment, the patient has consented to  Procedures with comments: LITHOTRIPSY, ESWL (Left) - LEFT EXTRACORPOREAL SHOCKWAVE LITHOTRIPSY as a surgical intervention.  The patient's history has been reviewed, patient examined, no change in status, stable for surgery.  I have reviewed the patient's chart and labs.  Questions were answered to the patient's satisfaction.     Forest Redwine A Dres[place] Lozito

## 2024-01-06 NOTE — Discharge Instructions (Signed)
 I have reviewed discharge instructions in detail with the patient. They will follow-up with me or their physician as scheduled. My nurse will also be calling the patients as per protocol.

## 2024-01-09 ENCOUNTER — Encounter (HOSPITAL_COMMUNITY): Payer: Self-pay | Admitting: Urology
